# Patient Record
Sex: Male | Born: 1940 | Hispanic: No | Smoking: Former smoker
Health system: Southern US, Community
[De-identification: ages and names within clinical notes are randomized; demographics above are authoritative.]

## PROBLEM LIST (undated history)

## (undated) DIAGNOSIS — F319 Bipolar disorder, unspecified: Secondary | ICD-10-CM

## (undated) DIAGNOSIS — F32A Depression, unspecified: Secondary | ICD-10-CM

## (undated) DIAGNOSIS — I10 Essential (primary) hypertension: Secondary | ICD-10-CM

## (undated) DIAGNOSIS — F329 Major depressive disorder, single episode, unspecified: Secondary | ICD-10-CM

## (undated) DIAGNOSIS — K5730 Diverticulosis of large intestine without perforation or abscess without bleeding: Secondary | ICD-10-CM

## (undated) DIAGNOSIS — D369 Benign neoplasm, unspecified site: Secondary | ICD-10-CM

## (undated) DIAGNOSIS — K635 Polyp of colon: Secondary | ICD-10-CM

## (undated) DIAGNOSIS — M1990 Unspecified osteoarthritis, unspecified site: Secondary | ICD-10-CM

## (undated) DIAGNOSIS — K589 Irritable bowel syndrome without diarrhea: Secondary | ICD-10-CM

## (undated) DIAGNOSIS — K2970 Gastritis, unspecified, without bleeding: Secondary | ICD-10-CM

## (undated) DIAGNOSIS — M6289 Other specified disorders of muscle: Secondary | ICD-10-CM

## (undated) DIAGNOSIS — K5792 Diverticulitis of intestine, part unspecified, without perforation or abscess without bleeding: Secondary | ICD-10-CM

## (undated) DIAGNOSIS — K219 Gastro-esophageal reflux disease without esophagitis: Secondary | ICD-10-CM

## (undated) DIAGNOSIS — D473 Essential (hemorrhagic) thrombocythemia: Secondary | ICD-10-CM

## (undated) DIAGNOSIS — K644 Residual hemorrhoidal skin tags: Secondary | ICD-10-CM

## (undated) DIAGNOSIS — G90519 Complex regional pain syndrome I of unspecified upper limb: Secondary | ICD-10-CM

## (undated) DIAGNOSIS — K648 Other hemorrhoids: Secondary | ICD-10-CM

## (undated) DIAGNOSIS — Z8673 Personal history of transient ischemic attack (TIA), and cerebral infarction without residual deficits: Secondary | ICD-10-CM

## (undated) HISTORY — PX: OTHER SURGICAL HISTORY: SHX169

## (undated) HISTORY — DX: Major depressive disorder, single episode, unspecified: F32.9

## (undated) HISTORY — DX: Depression, unspecified: F32.A

## (undated) HISTORY — DX: Benign neoplasm, unspecified site: D36.9

## (undated) HISTORY — DX: Unspecified osteoarthritis, unspecified site: M19.90

## (undated) HISTORY — DX: Diverticulosis of large intestine without perforation or abscess without bleeding: K57.30

## (undated) HISTORY — PX: TONSILLECTOMY: SHX5217

## (undated) HISTORY — DX: Gastritis, unspecified, without bleeding: K29.70

## (undated) HISTORY — DX: Personal history of transient ischemic attack (TIA), and cerebral infarction without residual deficits: Z86.73

## (undated) HISTORY — DX: Diverticulitis of intestine, part unspecified, without perforation or abscess without bleeding: K57.92

## (undated) HISTORY — DX: Bipolar disorder, unspecified: F31.9

## (undated) HISTORY — DX: Residual hemorrhoidal skin tags: K64.4

## (undated) HISTORY — PX: SPINE SURGERY: SHX786

## (undated) HISTORY — PX: CARPAL TUNNEL RELEASE: SHX101

## (undated) HISTORY — DX: Other hemorrhoids: K64.8

## (undated) HISTORY — DX: Polyp of colon: K63.5

## (undated) HISTORY — DX: Essential (primary) hypertension: I10

## (undated) HISTORY — DX: Complex regional pain syndrome I of unspecified upper limb: G90.519

## (undated) HISTORY — DX: Essential (hemorrhagic) thrombocythemia: D47.3

## (undated) HISTORY — DX: Gastro-esophageal reflux disease without esophagitis: K21.9

## (undated) HISTORY — DX: Other specified disorders of muscle: M62.89

## (undated) HISTORY — DX: Irritable bowel syndrome, unspecified: K58.9

## (undated) HISTORY — PX: APPENDECTOMY: SHX54

---

## 1999-05-30 ENCOUNTER — Encounter: Payer: Self-pay | Admitting: *Deleted

## 1999-05-30 ENCOUNTER — Ambulatory Visit (HOSPITAL_COMMUNITY): Admission: RE | Admit: 1999-05-30 | Discharge: 1999-05-30 | Payer: Self-pay | Admitting: *Deleted

## 1999-06-04 ENCOUNTER — Encounter: Payer: Self-pay | Admitting: *Deleted

## 1999-06-04 ENCOUNTER — Encounter: Admission: RE | Admit: 1999-06-04 | Discharge: 1999-06-04 | Payer: Self-pay | Admitting: *Deleted

## 1999-06-05 ENCOUNTER — Encounter: Payer: Self-pay | Admitting: *Deleted

## 1999-08-19 ENCOUNTER — Encounter: Payer: Self-pay | Admitting: Neurosurgery

## 1999-08-20 ENCOUNTER — Encounter: Payer: Self-pay | Admitting: Neurosurgery

## 1999-08-20 ENCOUNTER — Ambulatory Visit (HOSPITAL_COMMUNITY): Admission: RE | Admit: 1999-08-20 | Discharge: 1999-08-20 | Payer: Self-pay | Admitting: Neurosurgery

## 1999-10-08 ENCOUNTER — Encounter: Payer: Self-pay | Admitting: Neurosurgery

## 1999-10-08 ENCOUNTER — Ambulatory Visit (HOSPITAL_COMMUNITY): Admission: RE | Admit: 1999-10-08 | Discharge: 1999-10-08 | Payer: Self-pay | Admitting: Neurosurgery

## 2001-02-24 ENCOUNTER — Encounter: Payer: Self-pay | Admitting: Neurosurgery

## 2001-02-24 ENCOUNTER — Encounter: Admission: RE | Admit: 2001-02-24 | Discharge: 2001-02-24 | Payer: Self-pay | Admitting: Neurosurgery

## 2001-04-01 ENCOUNTER — Ambulatory Visit (HOSPITAL_BASED_OUTPATIENT_CLINIC_OR_DEPARTMENT_OTHER): Admission: RE | Admit: 2001-04-01 | Discharge: 2001-04-01 | Payer: Self-pay | Admitting: Neurosurgery

## 2002-06-23 ENCOUNTER — Encounter: Payer: Self-pay | Admitting: Family Medicine

## 2002-06-23 ENCOUNTER — Encounter: Admission: RE | Admit: 2002-06-23 | Discharge: 2002-06-23 | Payer: Self-pay | Admitting: Family Medicine

## 2002-07-05 ENCOUNTER — Encounter: Payer: Self-pay | Admitting: Internal Medicine

## 2002-07-18 ENCOUNTER — Encounter: Payer: Self-pay | Admitting: Internal Medicine

## 2002-09-20 ENCOUNTER — Encounter: Payer: Self-pay | Admitting: Internal Medicine

## 2002-10-04 ENCOUNTER — Encounter: Payer: Self-pay | Admitting: Internal Medicine

## 2002-10-04 HISTORY — PX: ESOPHAGOGASTRODUODENOSCOPY: SHX1529

## 2004-01-08 ENCOUNTER — Emergency Department (HOSPITAL_COMMUNITY): Admission: EM | Admit: 2004-01-08 | Discharge: 2004-01-08 | Payer: Self-pay | Admitting: Emergency Medicine

## 2004-05-10 ENCOUNTER — Emergency Department (HOSPITAL_COMMUNITY): Admission: EM | Admit: 2004-05-10 | Discharge: 2004-05-10 | Payer: Self-pay | Admitting: Emergency Medicine

## 2004-06-11 ENCOUNTER — Ambulatory Visit: Payer: Self-pay | Admitting: Family Medicine

## 2004-06-25 ENCOUNTER — Ambulatory Visit: Payer: Self-pay | Admitting: Family Medicine

## 2004-07-17 ENCOUNTER — Ambulatory Visit (HOSPITAL_BASED_OUTPATIENT_CLINIC_OR_DEPARTMENT_OTHER): Admission: RE | Admit: 2004-07-17 | Discharge: 2004-07-17 | Payer: Self-pay | Admitting: Orthopedic Surgery

## 2004-07-17 ENCOUNTER — Ambulatory Visit (HOSPITAL_COMMUNITY): Admission: RE | Admit: 2004-07-17 | Discharge: 2004-07-17 | Payer: Self-pay | Admitting: Orthopedic Surgery

## 2004-11-11 ENCOUNTER — Encounter: Admission: RE | Admit: 2004-11-11 | Discharge: 2004-11-11 | Payer: Self-pay | Admitting: Neurology

## 2005-04-22 ENCOUNTER — Ambulatory Visit: Payer: Self-pay | Admitting: Family Medicine

## 2005-04-25 ENCOUNTER — Inpatient Hospital Stay (HOSPITAL_COMMUNITY): Admission: EM | Admit: 2005-04-25 | Discharge: 2005-05-06 | Payer: Self-pay | Admitting: Emergency Medicine

## 2005-04-25 ENCOUNTER — Encounter: Admission: RE | Admit: 2005-04-25 | Discharge: 2005-04-25 | Payer: Self-pay | Admitting: Family Medicine

## 2005-05-20 ENCOUNTER — Ambulatory Visit: Payer: Self-pay | Admitting: Family Medicine

## 2005-06-03 ENCOUNTER — Encounter: Admission: RE | Admit: 2005-06-03 | Discharge: 2005-06-03 | Payer: Self-pay | Admitting: Neurosurgery

## 2005-06-04 ENCOUNTER — Ambulatory Visit: Payer: Self-pay | Admitting: Family Medicine

## 2005-06-10 ENCOUNTER — Encounter (INDEPENDENT_AMBULATORY_CARE_PROVIDER_SITE_OTHER): Payer: Self-pay | Admitting: Cardiology

## 2005-06-10 ENCOUNTER — Ambulatory Visit (HOSPITAL_COMMUNITY): Admission: RE | Admit: 2005-06-10 | Discharge: 2005-06-10 | Payer: Self-pay | Admitting: Family Medicine

## 2005-06-26 ENCOUNTER — Encounter: Admission: RE | Admit: 2005-06-26 | Discharge: 2005-06-26 | Payer: Self-pay | Admitting: Neurology

## 2005-07-04 ENCOUNTER — Ambulatory Visit (HOSPITAL_COMMUNITY): Admission: RE | Admit: 2005-07-04 | Discharge: 2005-07-04 | Payer: Self-pay | Admitting: *Deleted

## 2005-07-08 ENCOUNTER — Ambulatory Visit: Payer: Self-pay | Admitting: Family Medicine

## 2005-10-10 ENCOUNTER — Encounter: Admission: RE | Admit: 2005-10-10 | Discharge: 2005-10-10 | Payer: Self-pay | Admitting: Neurosurgery

## 2006-08-13 ENCOUNTER — Ambulatory Visit: Payer: Self-pay | Admitting: Family Medicine

## 2006-08-13 LAB — CONVERTED CEMR LAB
ALT: 24 units/L (ref 0–40)
AST: 24 units/L (ref 0–37)
Albumin: 4 g/dL (ref 3.5–5.2)
Alkaline Phosphatase: 45 units/L (ref 39–117)
BUN: 20 mg/dL (ref 6–23)
Basophils Absolute: 0 10*3/uL (ref 0.0–0.1)
Basophils Relative: 0.4 % (ref 0.0–1.0)
CO2: 28 meq/L (ref 19–32)
Calcium: 9.5 mg/dL (ref 8.4–10.5)
Chloride: 102 meq/L (ref 96–112)
Chol/HDL Ratio, serum: 2.3
Cholesterol: 187 mg/dL (ref 0–200)
Creatinine, Ser: 1.3 mg/dL (ref 0.4–1.5)
Eosinophil percent: 1.9 % (ref 0.0–5.0)
GFR calc non Af Amer: 59 mL/min
Glomerular Filtration Rate, Af Am: 71 mL/min/{1.73_m2}
Glucose, Bld: 114 mg/dL — ABNORMAL HIGH (ref 70–99)
HCT: 39.6 % (ref 39.0–52.0)
HDL: 81.7 mg/dL (ref >39.0)
Hemoglobin: 14 g/dL (ref 13.0–17.0)
Hgb A1c MFr Bld: 6.2 % — ABNORMAL HIGH (ref 4.6–6.0)
LDL Cholesterol: 95 mg/dL (ref 0–99)
Lymphocytes Relative: 26 % (ref 12.0–46.0)
MCHC: 35.3 g/dL (ref 30.0–36.0)
MCV: 99.1 fL (ref 78.0–100.0)
Monocytes Absolute: 0.8 10*3/uL — ABNORMAL HIGH (ref 0.2–0.7)
Monocytes Relative: 9.1 % (ref 3.0–11.0)
Neutro Abs: 5.3 10*3/uL (ref 1.4–7.7)
Neutrophils Relative %: 62.6 % (ref 43.0–77.0)
Platelets: 243 10*3/uL (ref 150–400)
Potassium: 3.7 meq/L (ref 3.5–5.1)
RBC: 4 M/uL — ABNORMAL LOW (ref 4.22–5.81)
RDW: 12.7 % (ref 11.5–14.6)
Sodium: 140 meq/L (ref 135–145)
TSH: 1.35 microintl units/mL (ref 0.35–5.50)
Total Bilirubin: 0.9 mg/dL (ref 0.3–1.2)
Total Protein: 7.8 g/dL (ref 6.0–8.3)
Triglyceride fasting, serum: 50 mg/dL (ref 0–149)
VLDL: 10 mg/dL (ref 0–40)
WBC: 8.5 10*3/uL (ref 4.5–10.5)

## 2006-09-10 IMAGING — CR DG CHEST 2V
2 series · 2 of 2 positions shown · non-contrast
Comparison: 04/26/05.

CLINICAL DATA: Preadmission for cerebral angio.
 CHEST - 2 VIEW:

[view not recorded (1 of 2)]
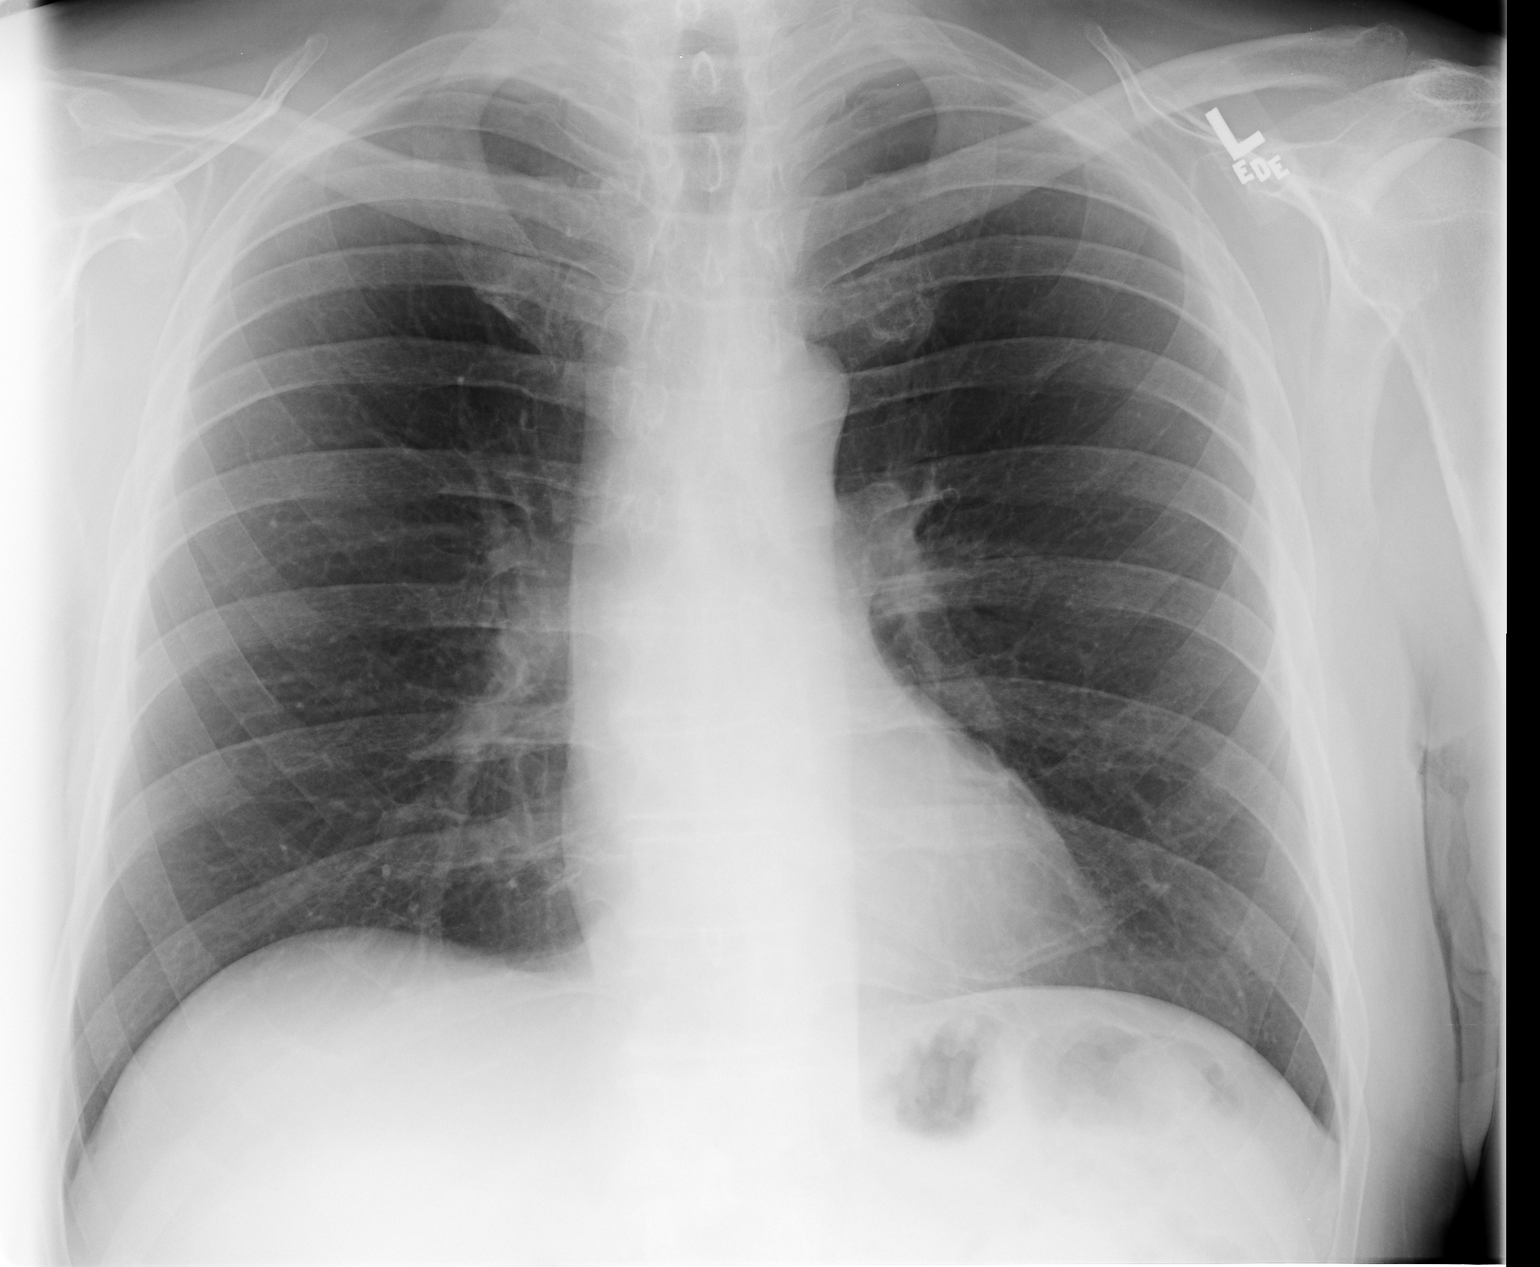

[view not recorded (2 of 2)]
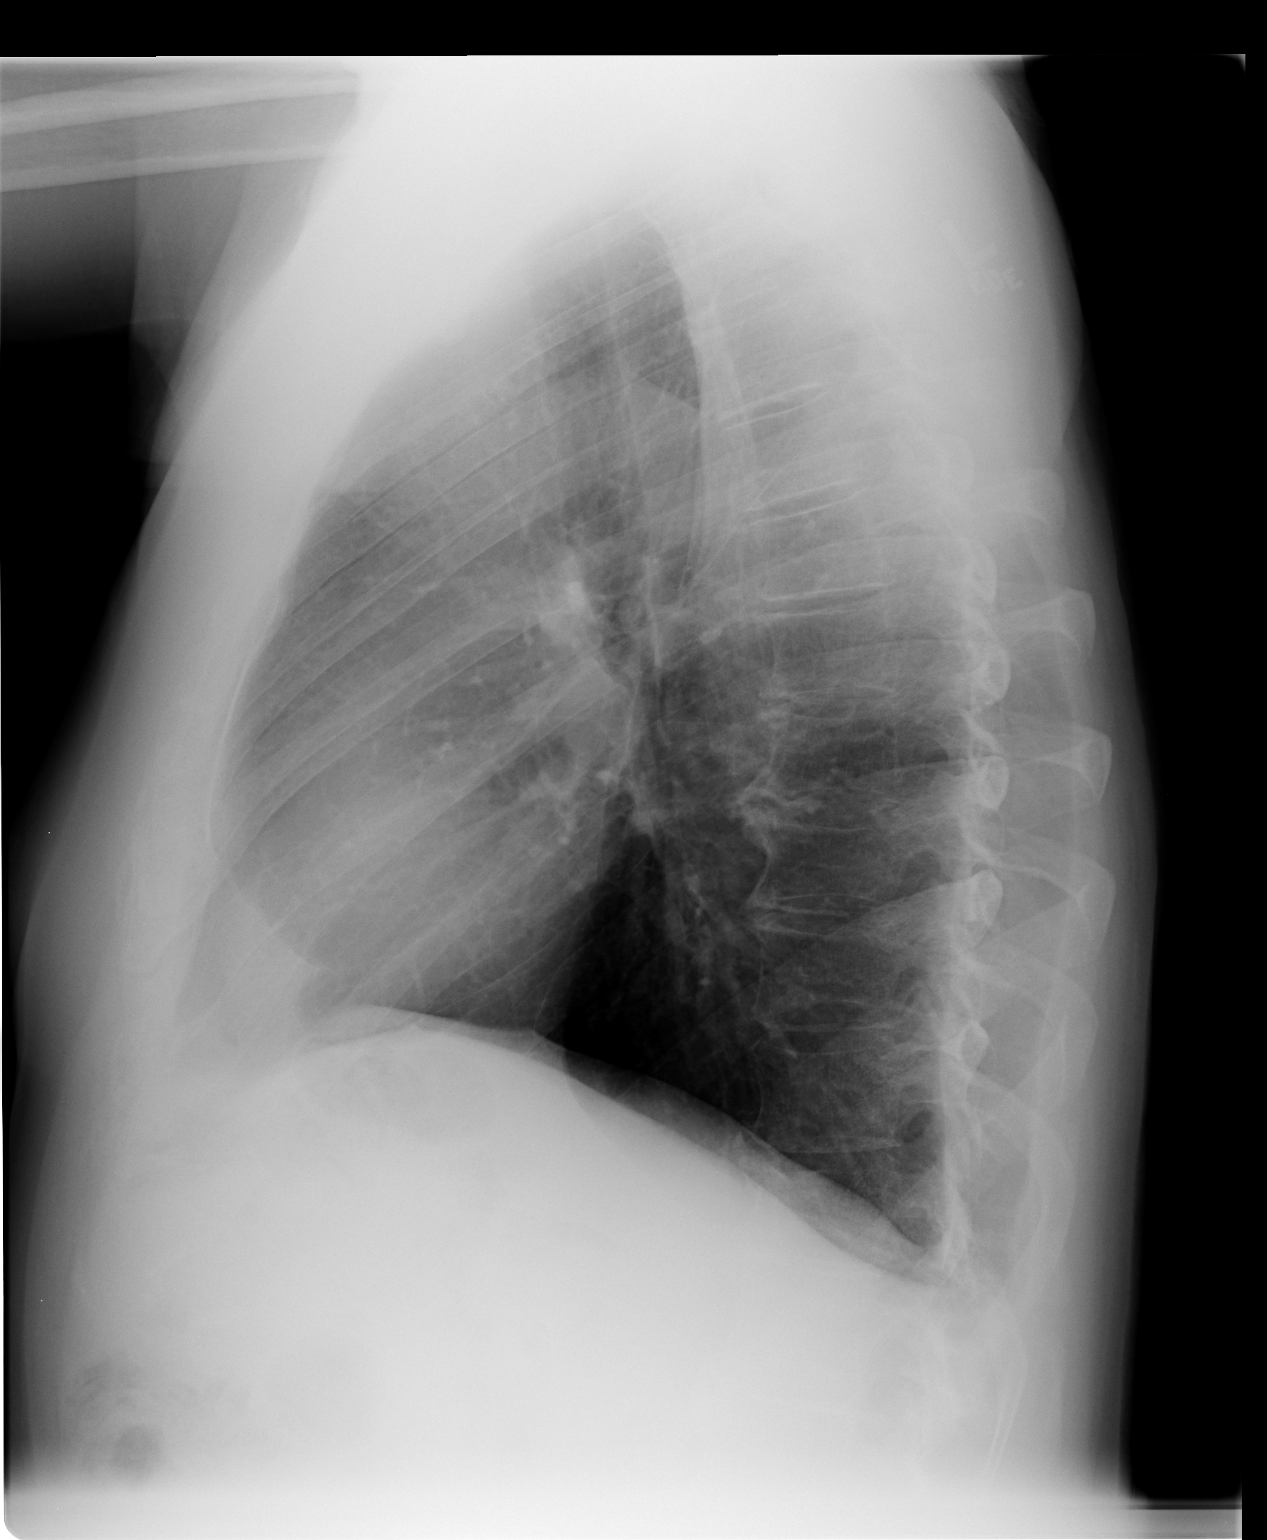

[2 of 2 positions shown; findings below may reference images not displayed]

The heart size and mediastinal contours are within normal limits.  Both lungs are clear.  The visualized skeletal structures are unremarkable.
IMPRESSION: No active cardiopulmonary disease.

## 2007-05-05 DIAGNOSIS — I10 Essential (primary) hypertension: Secondary | ICD-10-CM

## 2007-05-05 DIAGNOSIS — K219 Gastro-esophageal reflux disease without esophagitis: Secondary | ICD-10-CM | POA: Insufficient documentation

## 2007-09-27 ENCOUNTER — Ambulatory Visit: Payer: Self-pay | Admitting: Family Medicine

## 2007-09-27 DIAGNOSIS — G90519 Complex regional pain syndrome I of unspecified upper limb: Secondary | ICD-10-CM

## 2007-10-08 ENCOUNTER — Telehealth: Payer: Self-pay | Admitting: Family Medicine

## 2008-05-10 ENCOUNTER — Telehealth: Payer: Self-pay | Admitting: Family Medicine

## 2008-12-20 ENCOUNTER — Emergency Department (HOSPITAL_COMMUNITY): Admission: EM | Admit: 2008-12-20 | Discharge: 2008-12-20 | Payer: Self-pay | Admitting: Emergency Medicine

## 2008-12-20 ENCOUNTER — Encounter: Payer: Self-pay | Admitting: Family Medicine

## 2008-12-25 ENCOUNTER — Ambulatory Visit: Payer: Self-pay | Admitting: Family Medicine

## 2008-12-25 DIAGNOSIS — I6350 Cerebral infarction due to unspecified occlusion or stenosis of unspecified cerebral artery: Secondary | ICD-10-CM | POA: Insufficient documentation

## 2008-12-25 DIAGNOSIS — Z8679 Personal history of other diseases of the circulatory system: Secondary | ICD-10-CM | POA: Insufficient documentation

## 2009-01-18 ENCOUNTER — Encounter: Payer: Self-pay | Admitting: Family Medicine

## 2009-02-13 ENCOUNTER — Telehealth (INDEPENDENT_AMBULATORY_CARE_PROVIDER_SITE_OTHER): Payer: Self-pay | Admitting: *Deleted

## 2009-02-20 ENCOUNTER — Ambulatory Visit: Payer: Self-pay | Admitting: Family Medicine

## 2009-02-20 DIAGNOSIS — K5732 Diverticulitis of large intestine without perforation or abscess without bleeding: Secondary | ICD-10-CM | POA: Insufficient documentation

## 2009-03-01 ENCOUNTER — Ambulatory Visit: Payer: Self-pay | Admitting: Family Medicine

## 2009-03-01 DIAGNOSIS — K5900 Constipation, unspecified: Secondary | ICD-10-CM | POA: Insufficient documentation

## 2009-03-02 ENCOUNTER — Telehealth (INDEPENDENT_AMBULATORY_CARE_PROVIDER_SITE_OTHER): Payer: Self-pay | Admitting: *Deleted

## 2009-03-06 ENCOUNTER — Telehealth (INDEPENDENT_AMBULATORY_CARE_PROVIDER_SITE_OTHER): Payer: Self-pay | Admitting: *Deleted

## 2009-03-12 ENCOUNTER — Telehealth: Payer: Self-pay | Admitting: Family Medicine

## 2009-03-12 ENCOUNTER — Telehealth: Payer: Self-pay | Admitting: Internal Medicine

## 2009-03-15 ENCOUNTER — Ambulatory Visit: Payer: Self-pay | Admitting: Internal Medicine

## 2009-03-15 DIAGNOSIS — K589 Irritable bowel syndrome without diarrhea: Secondary | ICD-10-CM

## 2009-03-15 LAB — CONVERTED CEMR LAB
ALT: 27 units/L (ref 0–53)
AST: 24 units/L (ref 0–37)
Albumin: 4.6 g/dL (ref 3.5–5.2)
Alkaline Phosphatase: 38 units/L — ABNORMAL LOW (ref 39–117)
BUN: 22 mg/dL (ref 6–23)
Basophils Absolute: 0 10*3/uL (ref 0.0–0.1)
Basophils Relative: 0.6 % (ref 0.0–3.0)
CO2: 27 meq/L (ref 19–32)
Calcium: 9.7 mg/dL (ref 8.4–10.5)
Chloride: 100 meq/L (ref 96–112)
Creatinine, Ser: 1.4 mg/dL (ref 0.4–1.5)
Eosinophils Absolute: 0.2 10*3/uL (ref 0.0–0.7)
Eosinophils Relative: 2 % (ref 0.0–5.0)
GFR calc non Af Amer: 53.54 mL/min (ref >60)
Glucose, Bld: 109 mg/dL — ABNORMAL HIGH (ref 70–99)
HCT: 37.5 % — ABNORMAL LOW (ref 39.0–52.0)
Hemoglobin: 13.5 g/dL (ref 13.0–17.0)
IgA: 354 mg/dL (ref 68–378)
Lymphocytes Relative: 33.1 % (ref 12.0–46.0)
Lymphs Abs: 2.6 10*3/uL (ref 0.7–4.0)
MCHC: 35.8 g/dL (ref 30.0–36.0)
MCV: 98.9 fL (ref 78.0–100.0)
Monocytes Absolute: 0.8 10*3/uL (ref 0.1–1.0)
Monocytes Relative: 10.5 % (ref 3.0–12.0)
Neutro Abs: 4.3 10*3/uL (ref 1.4–7.7)
Neutrophils Relative %: 53.8 % (ref 43.0–77.0)
Platelets: 247 10*3/uL (ref 150.0–400.0)
Potassium: 3.6 meq/L (ref 3.5–5.1)
RBC: 3.8 M/uL — ABNORMAL LOW (ref 4.22–5.81)
RDW: 12.7 % (ref 11.5–14.6)
Sodium: 138 meq/L (ref 135–145)
TSH: 0.88 microintl units/mL (ref 0.35–5.50)
Total Bilirubin: 0.7 mg/dL (ref 0.3–1.2)
Total Protein: 7.9 g/dL (ref 6.0–8.3)
WBC: 7.9 10*3/uL (ref 4.5–10.5)

## 2009-03-23 ENCOUNTER — Telehealth: Payer: Self-pay | Admitting: Internal Medicine

## 2009-03-23 LAB — CONVERTED CEMR LAB: Tissue Transglutaminase Ab, IgA: 0.5 units (ref <7)

## 2009-03-29 ENCOUNTER — Telehealth: Payer: Self-pay | Admitting: Internal Medicine

## 2009-03-29 ENCOUNTER — Telehealth (INDEPENDENT_AMBULATORY_CARE_PROVIDER_SITE_OTHER): Payer: Self-pay | Admitting: *Deleted

## 2009-03-29 ENCOUNTER — Ambulatory Visit: Payer: Self-pay | Admitting: Internal Medicine

## 2009-04-18 ENCOUNTER — Telehealth: Payer: Self-pay | Admitting: Internal Medicine

## 2009-04-26 ENCOUNTER — Ambulatory Visit: Payer: Self-pay | Admitting: Internal Medicine

## 2009-04-26 ENCOUNTER — Telehealth: Payer: Self-pay | Admitting: Internal Medicine

## 2009-04-26 DIAGNOSIS — F411 Generalized anxiety disorder: Secondary | ICD-10-CM | POA: Insufficient documentation

## 2009-04-26 DIAGNOSIS — K644 Residual hemorrhoidal skin tags: Secondary | ICD-10-CM | POA: Insufficient documentation

## 2009-05-08 ENCOUNTER — Encounter: Payer: Self-pay | Admitting: Internal Medicine

## 2009-05-08 ENCOUNTER — Ambulatory Visit: Payer: Self-pay | Admitting: Internal Medicine

## 2009-05-08 ENCOUNTER — Telehealth: Payer: Self-pay | Admitting: Nurse Practitioner

## 2009-05-08 DIAGNOSIS — D369 Benign neoplasm, unspecified site: Secondary | ICD-10-CM

## 2009-05-08 HISTORY — DX: Benign neoplasm, unspecified site: D36.9

## 2009-05-08 HISTORY — PX: COLONOSCOPY: SHX174

## 2009-05-17 ENCOUNTER — Telehealth: Payer: Self-pay | Admitting: Internal Medicine

## 2009-07-17 ENCOUNTER — Telehealth: Payer: Self-pay | Admitting: Family Medicine

## 2009-07-19 ENCOUNTER — Telehealth: Payer: Self-pay | Admitting: Family Medicine

## 2009-09-20 ENCOUNTER — Telehealth: Payer: Self-pay | Admitting: Family Medicine

## 2009-09-24 ENCOUNTER — Ambulatory Visit: Payer: Self-pay | Admitting: Family Medicine

## 2009-10-10 ENCOUNTER — Telehealth: Payer: Self-pay | Admitting: Family Medicine

## 2009-11-26 ENCOUNTER — Ambulatory Visit: Payer: Self-pay | Admitting: Family Medicine

## 2009-12-31 ENCOUNTER — Telehealth: Payer: Self-pay | Admitting: Family Medicine

## 2010-01-02 ENCOUNTER — Ambulatory Visit: Payer: Self-pay | Admitting: Family Medicine

## 2010-03-05 ENCOUNTER — Telehealth: Payer: Self-pay | Admitting: Family Medicine

## 2010-03-11 ENCOUNTER — Telehealth: Payer: Self-pay | Admitting: Family Medicine

## 2010-05-21 ENCOUNTER — Telehealth: Payer: Self-pay | Admitting: Family Medicine

## 2010-05-22 ENCOUNTER — Ambulatory Visit: Payer: Self-pay | Admitting: Family Medicine

## 2010-05-22 DIAGNOSIS — F319 Bipolar disorder, unspecified: Secondary | ICD-10-CM

## 2010-07-12 ENCOUNTER — Emergency Department (HOSPITAL_COMMUNITY)
Admission: EM | Admit: 2010-07-12 | Discharge: 2010-07-12 | Payer: Self-pay | Source: Home / Self Care | Admitting: Emergency Medicine

## 2010-07-16 ENCOUNTER — Telehealth: Payer: Self-pay | Admitting: Internal Medicine

## 2010-07-17 ENCOUNTER — Telehealth: Payer: Self-pay | Admitting: Internal Medicine

## 2010-07-19 ENCOUNTER — Telehealth: Payer: Self-pay | Admitting: Internal Medicine

## 2010-07-22 ENCOUNTER — Telehealth: Payer: Self-pay | Admitting: Internal Medicine

## 2010-07-26 ENCOUNTER — Emergency Department: Payer: Self-pay | Admitting: Unknown Physician Specialty

## 2010-08-07 ENCOUNTER — Encounter (INDEPENDENT_AMBULATORY_CARE_PROVIDER_SITE_OTHER): Payer: Self-pay | Admitting: *Deleted

## 2010-08-07 ENCOUNTER — Emergency Department (HOSPITAL_COMMUNITY)
Admission: EM | Admit: 2010-08-07 | Discharge: 2010-08-07 | Payer: Self-pay | Source: Home / Self Care | Admitting: Emergency Medicine

## 2010-08-14 ENCOUNTER — Ambulatory Visit
Admission: RE | Admit: 2010-08-14 | Discharge: 2010-08-14 | Payer: Self-pay | Source: Home / Self Care | Attending: Internal Medicine | Admitting: Internal Medicine

## 2010-08-16 ENCOUNTER — Telehealth: Payer: Self-pay | Admitting: Internal Medicine

## 2010-08-19 ENCOUNTER — Telehealth: Payer: Self-pay | Admitting: Internal Medicine

## 2010-08-31 ENCOUNTER — Encounter: Payer: Self-pay | Admitting: *Deleted

## 2010-09-12 NOTE — Progress Notes (Signed)
Summary: Triage  Phone Note Call from Patient Call back at Home Phone [phone]   Caller: Patient Call For: Dr. Leone Payor Reason for Call: Talk to Nurse Summary of Call: abd pain and discomfort, diarrhea "symptoms are worse than ever" Initial call taken by: Karna Christmas,  July 19, 2010 9:11 AM  Follow-up for Phone Call        Patient  has had some abdominal discomfort this am and feels he is going to have diarrhea.  He wants to know what he can take.  Patient  is advised if he is having abdominal pain he should take his pain meds previously prescribed tramadol or Tylenol #3.  He is advised not to take imodium unless he has multiple diarrhea stools.  he is asked to go on a bland diet with low fiber for a few days and can resume his high fiber diet when his stomach is no longer "upset".  Patient  will call back for further problems or concerns. Follow-up by: Darcey Nora RN, CGRN,  July 19, 2010 9:56 AM  Additional Follow-up for Phone Call Additional follow up Details #1::       Additional Follow-up by: Iva Boop MD, [facility],  July 19, 2010 10:00 AM

## 2010-09-12 NOTE — Progress Notes (Signed)
Summary: FYI  Phone Note Call from Patient   Caller: Patient Call For: [name] MD Summary of Call: Pt is calling to let Dr. Clent Ridges know how he is doing with the Tylenol # 3.  It is not working as well, and it making him very constipated, and when he takes a laxative, he has no control of bowels. [facility] getting [facility] frustrated and depressed. [phone] Initial call taken by: Lynann Beaver CMA,  October 10, 2009 9:44 AM  Follow-up for Phone Call        try Tramadol 50 mg , take 1 or 2 q 6 hours as needed pain, call in #60 with 5 rf Follow-up by: [name] MD,  October 10, 2009 12:04 PM  Additional Follow-up for Phone Call Additional follow up Details #1::        Phone Call Completed, Rx Called In Additional Follow-up by: Alfred Levins, CMA,  October 10, 2009 1:30 PM    New/Updated Medications: TRAMADOL HCL 50 MG  TABS (TRAMADOL HCL) 1-2 by mouth every 6 hours as needed for pain Prescriptions: TRAMADOL HCL 50 MG  TABS (TRAMADOL HCL) 1-2 by mouth every 6 hours as needed for pain  #60 x 5   Entered by:   Alfred Levins, CMA   Authorized by:   [name] MD   Signed by:   Alfred Levins, CMA on 10/10/2009   Method used:   [facility]  [address] [id number]       [address]       [place], [place]  [address]       Ph: [phone] or [phone]       Fax: [phone]   RxID:   [id number]

## 2010-09-12 NOTE — Assessment & Plan Note (Signed)
Summary: constipation/congestion/ recent diagnosis of Bipolar/dm   Vital Signs:  Patient profile:   70 year old male Weight:      178 pounds O2 Sat:      96 % Temp:     98.2 degrees F Pulse rate:   66 / minute BP sitting:   122 / 82  (right arm) Cuff size:   regular  Vitals Entered By: Pura Spice, RN (May 22, 2010 10:50 AM)  Contraindications/Deferment of Procedures/Staging:    Test/Procedure: FLU VAX    Reason for deferment: patient declined  CC: feels like intestines pushing against lungs and "multiple Issues"  States sees psychiatrist in [place]   History of Present Illness: Here to follow up after a 2 week psychiatric stay at [facility] for showing threatening behavior toward his [name] and for possibly being suicidal. he was diagnosed with Bipolar Disorder, and after discharge he has been seeing a psychiatrist in [place] named Dr. Lynnae Sandhoff. He was put on Paroxetene and Risperdol, in addition to the Trazodone he had been taking. John [name], but denies ever harming her physically. She has taken out a restraining order against him, and she has filed for a divorce from him. She has emptied all of their joint bank accounts, and he now says he has no money. He had been living at a motel, and he does not know where to go from here. He admits to buying a "helium kit" off the Internet, and he has done research on line about how to commit suicide by breathing helium. He says he does not intend to do this anytime soon, but he wanted to have this available for the future.   Allergies: 1)  ! * Latex 2)  ! Hydrocodone  Past History:  Past Medical History: GERD Hypertension Diverticulitis, hx of Diverticulosis, colon reflex sympathetic dystrophy left arm Cerebrovascular accident, hx of. Has had two so far IBS, sees Dr. Leone Payor bipolar disorder, sees Dr. Lynnae Sandhoff in [place]  [place]  Review of Systems  The patient denies anorexia, fever, weight loss, weight gain, vision loss, decreased hearing, hoarseness, chest pain, syncope, dyspnea on exertion, peripheral edema, prolonged cough, headaches, hemoptysis, abdominal pain, melena, hematochezia, severe indigestion/heartburn, hematuria, incontinence, genital sores, muscle weakness, suspicious skin lesions, transient blindness, difficulty walking, unusual weight change, abnormal bleeding, enlarged lymph nodes, angioedema, breast masses, and testicular masses.    Physical Exam  General:  Well-developed,well-nourished,in no acute distress; alert,appropriate and cooperative throughout examination Lungs:  Normal respiratory effort, chest expands symmetrically. Lungs are clear to auscultation, no crackles or wheezes. Heart:  Normal rate and regular rhythm. S1 and S2 normal without gallop, murmur, click, rub or other extra sounds. Psych:  Oriented X3, memory intact for recent and remote, normally interactive, good eye contact, labile affect, tearful, and moderately anxious.     Impression & Recommendations:  Problem # 1:  BIPOLAR AFFECTIVE DISORDER (ICD-296.80)  Orders: Home Health Referral (Home Health)  Problem # 2:  IRRITABLE BOWEL SYNDROME (ICD-564.1)  Complete Medication List: 1)  Atenolol-chlorthalidone 50-25 Mg Tabs (Atenolol-chlorthalidone) .... Every morning 2)  Adult Aspirin Ec Low Strength 81 Mg Tbec (Aspirin) .... Once daily 3)  Imodium Advanced 2-125 Mg Tabs (Loperamide-simethicone) .... As needed 4)  Tylenol With Codeine #3 300-30 Mg Tabs (Acetaminophen-codeine) .Marland Kitchen.. 1 q 6 hours as needed pain 5)  Tramadol Hcl 50 Mg Tabs (Tramadol hcl) .Marland Kitchen.. 1-2 by mouth every  6 hours as needed for pain 6)  Trazodone Hcl 100 Mg Tabs (Trazodone hcl) .... At bedtime 7)  Paroxetine Hcl 10 Mg Tabs (Paroxetine hcl) .... Once daily 8)  Risperdal 1 Mg Tabs (Risperidone) .... Once daily  Patient Instructions: 1)  He seems to be  fairly stable right now from a psychiatric standpoint, and he does intend to follow up with Dr. Donnalee Curry next week. We will set up a Home Health Social Work consult to meet with him and to give him advice about where to live, finances, possibly applying for Medicaid, etc.

## 2010-09-12 NOTE — Progress Notes (Signed)
Summary: Referral  Phone Note Call from Patient   Summary of Call: Pt called requesting a referral to [facility] for Infectious Disease Selena Batten). Pt states he has done some research on Lyme Disease and thinks this is what he may have? Please advise? Pt would like a call back today or tomorrow stating if referral was done [phone] Initial call taken by: Josph Macho RMA,  March 11, 2010 4:07 PM  Follow-up for Phone Call        please do this referral  Follow-up by: [name] MD,  March 13, 2010 8:44 AM

## 2010-09-12 NOTE — Assessment & Plan Note (Signed)
Summary: diarrhea/dm   Vital Signs:  Patient profile:   70 year old male Weight:      185 pounds BMI:     26.64 Temp:     98.2 degrees F oral BP sitting:   130 / 88  (left arm) Cuff size:   regular  Vitals Entered By: Raechel Ache, RN (November 26, 2009 4:37 PM) CC: C/o constipation alt with diarrhea; which has been bad last few days- can't stay out of the bathroom. Abd burning, nausea and noisy.   History of Present Illness: Here with continuing problems of severe diarrhea and urgency and frequency of BMs which alternate with constipation. He has not seen Dr. Leone Payor for some time. He uses magnesium citrate and Fleet enemas when constipated , then uses Lomotil when he gets diarrhea. He had tried nortripyline last year but stopped because it did not help. He denies any fever or nausea lately.   Allergies: 1)  ! * Latex 2)  ! Hydrocodone  Past History:  Past Medical History: GERD Hypertension Diverticulitis, hx of Diverticulosis, colon reflex sympathetic dystrophy left arm Cerebrovascular accident, hx of. Has had two so far IBS, sees Dr. Leone Payor  Past Surgical History: Reviewed history from 09/24/2009 and no changes required. colonoscopy 05-08-09 per Dr. Leone Payor, polyps and diverticulosis, repeat in 5 yrs Appendectomy Left Elbow Surgery  x 2 Carpal Tunnel Surgery left arm  Surgery on spine   Review of Systems  The patient denies anorexia, fever, weight loss, weight gain, vision loss, decreased hearing, hoarseness, chest pain, syncope, dyspnea on exertion, peripheral edema, prolonged cough, headaches, hemoptysis, abdominal pain, melena, hematochezia, severe indigestion/heartburn, hematuria, incontinence, genital sores, muscle weakness, suspicious skin lesions, transient blindness, difficulty walking, depression, unusual weight change, abnormal bleeding, enlarged lymph nodes, angioedema, breast masses, and testicular masses.    Physical Exam  General:   Well-developed,well-nourished,in no acute distress; alert,appropriate and cooperative throughout examination Neck:  No deformities, masses, or tenderness noted. Lungs:  Normal respiratory effort, chest expands symmetrically. Lungs are clear to auscultation, no crackles or wheezes. Heart:  Normal rate and regular rhythm. S1 and S2 normal without gallop, murmur, click, rub or other extra sounds. Abdomen:  soft, normal bowel sounds, no distention, no masses, no guarding, no rigidity, no rebound tenderness, no abdominal hernia, no inguinal hernia, no hepatomegaly, and no splenomegaly.  Mildly tender diffusely   Impression & Recommendations:  Problem # 1:  IRRITABLE BOWEL SYNDROME (ICD-564.1)  Orders: Prescription Created Electronically [phone])  Problem # 2:  ANXIETY (ICD-300.00)  His updated medication list for this problem includes:    Trazodone Hcl 100 Mg Tabs (Trazodone hcl) .Marland Kitchen... At bedtime  Orders: Prescription Created Electronically [phone])  Complete Medication List: 1)  Atenolol-chlorthalidone 50-25 Mg Tabs (Atenolol-chlorthalidone) .... Every morning 2)  Adult Aspirin Ec Low Strength 81 Mg Tbec (Aspirin) .... Once daily 3)  Imodium Advanced 2-125 Mg Tabs (Loperamide-simethicone) .... As needed 4)  Tylenol With Codeine #3 300-30 Mg Tabs (Acetaminophen-codeine) .Marland Kitchen.. 1 q 6 hours as needed pain 5)  Tramadol Hcl 50 Mg Tabs (Tramadol hcl) .Marland Kitchen.. 1-2 by mouth every 6 hours as needed for pain 6)  Trazodone Hcl 100 Mg Tabs (Trazodone hcl) .... At bedtime  Patient Instructions: 1)  Try Trazodone daily. Call me in 2 weeks with an update Prescriptions: TRAZODONE HCL 100 MG TABS (TRAZODONE HCL) at bedtime  #30 x 5   Entered and Authorized by:   [name] MD   Signed by:   [name] MD  on 11/26/2009   Method used:   [facility]  [address] [id number]       [address]       [place], [place]  [address]       Ph: [phone] or  [phone]       Fax: [phone]   RxID:   [id number]

## 2010-09-12 NOTE — Progress Notes (Signed)
Summary: speak to nurse  Phone Note Call from Patient Call back at Home Phone [phone]   Caller: Patient Call For: Dr Leone Payor Reason for Call: Talk to Nurse Summary of Call: Patient wants to speak to nurse regarding instructions given to him yesterday. Initial call taken by: Tawni Levy,  August 16, 2010 8:36 AM  Follow-up for Phone Call        Pt states he has watery loose BM last night after taking the Miralax purge all day yesterday. Slept really well last night and had a small loose BM this morning.  Pt wants to know if he should continue the Miralax daily even if he is now having loose BM's and if so what should his diet consist of to help with keeping his bowels regular.  Additional Follow-up for Phone Call Additional follow up Details #1::        Sorting out period stay on at least 1 MiraLax daily for now and see what happens call back as needed  Additional Follow-up by: Iva Boop MD, Clementeen Graham,  August 16, 2010 9:33 AM    Additional Follow-up for Phone Call Additional follow up Details #2::    Pt notified to try Miralax once a day for now and if needed to increase, he could so to twice a day dosing. Otherwise, to call back as needed. Pt agreed and verbalized understanding.  Follow-up by: Christie Nottingham CMA Duncan Dull),  August 16, 2010 9:42 AM   Appended Document: speak to nurse Pt called back to state he cannot take Miralax today because his stomach is churning. Pt would like to take a Imodium for his diarrhea. Informed pt to not take Imodium and wait til tomorrow to take the Miralax and gives his bowels a rest. Told pt if no better to call on Monday. Pt verbalized understanding.

## 2010-09-12 NOTE — Progress Notes (Signed)
Summary: update.  Phone Note Call from Patient   Caller: Patient Call For: [name] MD Summary of Call: Pt is calling to give Dr. Clent Ridges an update on the Trazodone.  He uses it with Unisom, and it works fairly well.  His IBS symptoms are worse, and he is lethargic and sounds extremely depressed.Marland Kitchen  Has not motiviation or energy to do anything. [phone] Initial call taken by: Lynann Beaver CMA,  Dec 31, 2009 8:38 AM  Follow-up for Phone Call        dr Claris Che patient Follow-up by: Gordy Savers  MD,  Dec 31, 2009 12:53 PM  Additional Follow-up for Phone Call Additional follow up Details #1::        okay to stick with Unisom and Trazodone at night. He needs to come in to discuss depression with me when he is ready Additional Follow-up by: [name] MD,  Dec 31, 2009 3:50 PM    Additional Follow-up for Phone Call Additional follow up Details #2::    Pt notified of Dr. Claris Che recommendations. Follow-up by: Lynann Beaver CMA,  Dec 31, 2009 4:13 PM

## 2010-09-12 NOTE — Progress Notes (Signed)
Summary: OV  Phone Note Call from Patient   Caller: Patient Call For: [name] MD Summary of Call: cannot tolerate Vicodin- makes him too constipated and doesn't help pain. [facility]eds something else[facility] Initial call taken by: Raechel Ache, RN,  September 20, 2009 11:12 AM  Follow-up for Phone Call        make an OV to discuss this Follow-up by: [name] MD,  September 20, 2009 12:22 PM  Additional Follow-up for Phone Call Additional follow up Details #1::        OV scheduled. Additional Follow-up by: Lynann Beaver CMA,  September 20, 2009 12:45 PM

## 2010-09-12 NOTE — Assessment & Plan Note (Signed)
Summary: to discuss pain meds/dm   Vital Signs:  Patient profile:   70 year old male Weight:      184 pounds Temp:     98.1 degrees F oral Pulse rate:   72 / minute BP sitting:   134 / 88  (left arm) Cuff size:   large  Vitals Entered By: Alfred Levins, CMA (September 24, 2009 10:49 AM) CC: discuss med   History of Present Illness: Here to discuss problems with his pain management. he had done well with Darvocet over the years, but of course this was pulled from the market. We then tried Vicodin, which worked well for the pain, but it caused terrible constipation evn though he took it only twice a day. He tried to counter this with fiber, Miralax, milk of magnesia, etc. but nothing worked. he used glycerin suppositories and Fleet enemas to get any sort of bowel movements.   Current Medications (verified): 1)  Atenolol-Chlorthalidone 50-25 Mg  Tabs (Atenolol-Chlorthalidone) .... Every Morning 2)  Adult Aspirin Ec Low Strength 81 Mg Tbec (Aspirin) .... Once Daily 3)  Propoxyphene N-Apap 100-650 Mg Tabs (Propoxyphene N-Apap) .Marland Kitchen.. 1 By Mouth Every 6 Hours As Needed For Pain 4)  Imodium Advanced 2-125 Mg Tabs (Loperamide-Simethicone) .... As Needed 5)  Nortriptyline Hcl 25 Mg  Caps (Nortriptyline Hcl) .... 1/2 By Mouth Q Hs X 1 Week Then Increase To 1 Q Hs  Allergies: 1)  ! * Latex 2)  ! Hydrocodone  Past History:  Past Medical History: Reviewed history from 03/15/2009 and no changes required. GERD Hypertension Diverticulitis, hx of Diverticulosis, colon reflex sympathetic dystrophy left arm Cerebrovascular accident, hx of. Has had two so far IBS  Past Surgical History: colonoscopy 05-08-09 per Dr. Leone Payor, polyps and diverticulosis, repeat in 5 yrs Appendectomy Left Elbow Surgery  x 2 Carpal Tunnel Surgery left arm  Surgery on spine   Review of Systems  The patient denies anorexia, fever, weight loss, weight gain, vision loss, decreased hearing, hoarseness, chest pain,  syncope, dyspnea on exertion, peripheral edema, prolonged cough, headaches, hemoptysis, melena, hematochezia, severe indigestion/heartburn, hematuria, incontinence, genital sores, muscle weakness, suspicious skin lesions, transient blindness, difficulty walking, depression, unusual weight change, abnormal bleeding, enlarged lymph nodes, angioedema, breast masses, and testicular masses.    Physical Exam  General:  Well-developed,well-nourished,in no acute distress; alert,appropriate and cooperative throughout examination Abdomen:  Bowel sounds positive,abdomen soft and non-tender without masses, organomegaly or hernias noted.   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 Every morning 2)  Adult Aspirin Ec Low Strength 81 Mg Tbec (Aspirin) .... Once daily 3)  Imodium Advanced 2-125 Mg Tabs (Loperamide-simethicone) .... As needed 4)  Tylenol With Codeine #3 300-30 Mg Tabs (Acetaminophen-codeine) .Marland Kitchen.. 1 q 6 hours as needed pain  Patient Instructions: 1)  try Tylenol #3. let me know how this works. Prescriptions: TYLENOL WITH CODEINE #3 300-30 MG TABS (ACETAMINOPHEN-CODEINE) 1 q 6 hours as needed pain  #60 x 2   Entered and Authorized by:   [name] MD   Signed by:   [name] MD on 09/24/2009   Method used:   Print then Give to Patient   RxID:   [id number]

## 2010-09-12 NOTE — Progress Notes (Signed)
Summary: FYI  Phone Note Call from Patient   Caller: Patient Call For: [name] MD Summary of Call: Pt is complaining of constipation/diarrhea x 6 months.  Diagnosed as being Bipolar.......Multiple  complaints that will require a lengthy consultation.  Scheduled tomorrow. Initial call taken by: Lynann Beaver CMA,  May 21, 2010 4:06 PM  Follow-up for Phone Call        noted Follow-up by: [name] MD,  May 21, 2010 4:43 PM

## 2010-09-12 NOTE — Progress Notes (Signed)
Summary: Triage  Phone Note Call from Patient Call back at Home Phone [phone]   Caller: Patient Call For: Dr.Gessner Reason for Call: Talk to Nurse Summary of Call: Pt is having ongoing constipation with no relief, wants to discuss options with nurse Initial call taken by: [place] Johnson,  July 16, 2010 10:30 AM  Follow-up for Phone Call        Spoke with patient, states he is very contipated. Went to [facility] ER Friday and was told to take Miralax twice a day. This has not helped. States he has not had a bowel movement in 8 days now. Patient has made himself have a BM by using saline enemas and glycerin supp. Now states this is not working. Patient states he feels pressure and bloating but is only passing gas. Please advise. Selinda Michaels RN  July 16, 2010 11:19 AM       Try Colyte prep preceded by 4 Dulcolax follow-up call from him tomorrow Follow-up by: Iva Boop MD, Clementeen Graham,  July 16, 2010 11:09 AM  Additional Follow-up for Phone Call Additional follow up Details #1::        Patient notifed of Dr. Marvell Fuller recommendations. Instructed patient to call us tomorrow if not better. Additional Follow-up by: Selinda Michaels RN,  July 16, 2010 11:20 AM    New/Updated Medications: NULYTELY WITH FLAVOR PACKS 420 GM SOLR (PEG 3350-KCL-NA BICARB-NACL) Mix with water and drink 8oz every 15 minutes until gone DULCOLAX 5 MG TBEC (BISACODYL) Take 2 tablet by mouth with 1st glass of prep and 2 tablets with last glass of prep Prescriptions: DULCOLAX 5 MG TBEC (BISACODYL) Take 2 tablet by mouth with 1st glass of prep and 2 tablets with last glass of prep  #4 x 0   Entered by:   Selinda Michaels RN   Authorized by:   Iva Boop MD, [facility]   Signed by:   Selinda Michaels RN on 07/16/2010   Method used:   [facility]  [address] [id number]       [address]       [place], [place]  [address]       Ph: [phone] or [phone]       Fax:  [phone]   RxID:   [id number] NULYTELY WITH FLAVOR PACKS 420 GM SOLR (PEG 3350-KCL-NA BICARB-NACL) Mix with water and drink 8oz every 15 minutes until gone  #1 x 0   Entered by:   Selinda Michaels RN   Authorized by:   Iva Boop MD, [facility]   Signed by:   Selinda Michaels RN on 07/16/2010   Method used:   [facility]        [facility]  [address] [id number]       [address]       [place], [place]  [address]       Ph: [phone] or [phone]       Fax: [phone]   RxID:   [id number]

## 2010-09-12 NOTE — Progress Notes (Signed)
Summary: med ?'s  Phone Note Call from Patient Call back at [facility] Phone [phone]   Caller: Patient Call For: Dr. Leone Payor Reason for Call: Talk to Nurse Summary of Call: has ?'s about [facility] rx Initial call taken by: Vallarie Mare,  July 22, 2010 3:43 PM  Follow-up for Phone Call        Patient  states no further BM since Friday.  He denies taking imodium for the diarrhea he had.  I have asked him to resume his miralax two times a day and if no BM on the 3rd day he is asked to take dulcolax 2 tablets.  He will call me back for any further problems. Follow-up by: Darcey Nora RN, CGRN,  July 22, 2010 3:56 PM

## 2010-09-12 NOTE — Progress Notes (Signed)
Summary: Triage  Phone Note Call from Patient Call back at Home Phone [phone]   Caller: Patient Call For: Dr. Leone Payor Reason for Call: Talk to Nurse Summary of Call: Asking to speak directly to [facility]..."continuing phone call from last week" Initial call taken by: Karna Christmas,  August 19, 2010 10:00 AM  Follow-up for Phone Call        I have advised the patient to start back on Miralax 1-2 times a day as ordered.  Patient wants to try Metamucil.  I have advised him ok to try Metamucil, but he must drink plenty of fluid.   Follow-up by: Darcey Nora RN, CGRN,  August 19, 2010 11:14 AM

## 2010-09-12 NOTE — Progress Notes (Signed)
Summary: dr fry call please  Phone Note Call from Patient Call back at Home Phone [phone]   Caller: Patient Call For: [name] MD Summary of Call: Pt is requesting doc to call him personally. Initial call taken by: Heron Sabins,  March 05, 2010 9:44 AM  Follow-up for Phone Call        please call him Follow-up by: [name] MD,  March 05, 2010 4:35 PM  Additional Follow-up for Phone Call Additional follow up Details #1::        called- he would not discuss with me. Offered appt tomorrow with Dr Clent Ridges- he declined. Additional Follow-up by: Raechel Ache, RN,  March 05, 2010 4:50 PM

## 2010-09-12 NOTE — Assessment & Plan Note (Signed)
Summary: hosp f.u last week...em   History of Present Illness Visit Type: Follow-up Visit Primary GI MD: Stan Head MD [facility] Primary Provider: Gershon Crane, MD Requesting Provider: n/a Chief Complaint: Hospital Follow up History of Present Illness:   Patient complains of constant lower abdominal pain and constipation. He states that he has been to the ER several times but still no sucess in having a BM. Patient has taken multiple laxitives and stool softners but he is not taking any currently.  Hospitalized for bipolar problems in [place] in the Fall. The constipation became worse after that. He has had chronic problems but not this bad. He has been to the ED 3 times in December due to constipation-associated pain. He will get relief from defecation but is unable to [facility] defecation.  Last bowel movement was 1 week ago "induced" at the hospital with a dulocolax laxative suppository.    his wife participated in the history-taking.   GI Review of Systems    Reports abdominal pain.     Location of  Abdominal pain: lower abdomen.    Denies acid reflux, belching, bloating, chest pain, dysphagia with liquids, dysphagia with solids, heartburn, loss of appetite, nausea, vomiting, vomiting blood, weight loss, and  weight gain.      Reports constipation and  rectal pain.     Denies anal fissure, black tarry stools, change in bowel habit, diarrhea, diverticulosis, fecal incontinence, heme positive stool, hemorrhoids, irritable bowel syndrome, jaundice, light color stool, liver problems, and  rectal bleeding.    Current Medications (verified): 1)  Atenolol-Chlorthalidone 50-25 Mg  Tabs (Atenolol-Chlorthalidone) .... Every Morning 2)  Ecotrin 325 Mg Tbec (Aspirin) .... Take One By Mouth Once Daily 3)  Tramadol Hcl 50 Mg  Tabs (Tramadol Hcl) .... Take One Tablet By Mouth Once Daily 4)  Risperdal 1 Mg Tabs (Risperidone) .... Once Daily 5)  Depakote 125 Mg Tbec (Divalproex Sodium) .... Take  One By Mouth Once Daily 6)  Sertraline Hcl 50 Mg Tabs (Sertraline Hcl) .... Take One By Mouth At Bedtime  Allergies (verified): 1)  ! * Latex 2)  ! Hydrocodone  Past History:  Past Medical History: Reviewed history from 05/22/2010 and no changes required. GERD Hypertension Diverticulitis, hx of Diverticulosis, colon reflex sympathetic dystrophy left arm Cerebrovascular accident, hx of. Has had two so far IBS, sees Dr. Leone Payor bipolar disorder, sees Dr. Lynnae Sandhoff in [place]  Past Surgical History: Reviewed history from 09/24/2009 and no changes required. colonoscopy 05-08-09 per Dr. Leone Payor, polyps and diverticulosis, repeat in 5 yrs Appendectomy Left Elbow Surgery  x 2 Carpal Tunnel Surgery left arm  Surgery on spine   Family History: Reviewed history from 03/15/2009 and no changes required. Family History of Alcoholism/Addiction Family History Ovarian cancer: Mother  No FH of Colon Cancer: Family History of Celiac Disease:Brother   Social History: Reviewed history from 03/15/2009 and no changes required. Married Alcohol use-yes Patient is a former smoker.  Daily Caffeine Use: 4 cups of coffee daily  Illicit Drug Use - no Patient does not get regular exercise.   Review of Systems       bipolar disorder under better control  Vital Signs:  Patient profile:   70 year old male Height:      70 inches Weight:      169.6 pounds BMI:     24.42 Pulse rate:   56 / minute Pulse rhythm:   regular BP sitting:   90 / 58  (right arm) Cuff size:  regular  Vitals Entered By: Harlow Mares CMA Duncan Dull) (August 14, 2010 4:12 PM)  Physical Exam  Lungs:  Clear throughout to auscultation. Heart:  Regular rate and rhythm; no murmurs, rubs,  or bruits. Abdomen:  soft and nontender Rectal:  slightly firm impaction ? slightly reduced resting tone unable to examine urther due to impaction   Impression & Recommendations:  Problem # 1:  CONSTIPATION  (ICD-564.00) Assessment Deteriorated He has been struggling with this since change in psychiatric medication. he also has known  IBS. Will try regimen of regular MiraLax and as needed dulcolax to try to have defecation at least every 3 days. MiraLax purge first (tomorrow). Follow-up later to reassess with rectal exam again but more involved as I suspect possible disordered defecation/pelvic florr problems given that he reports significant straining to stool. He may need an anorecatl manometry and possible biofeedback training. He has some bladder symptoms also with mainly urinary frequency.  Problem # 2:  IRRITABLE BOWEL SYNDROME (ICD-564.1) Assessment: Deteriorated New regimen for constipation as under constipation. He has had diarrhea in the past but ? if at least more lately when he has had diarrhea at times if it has not been an overflow and/or unloading of stool phenomenon.  Patient Instructions: 1)  Take 4 Dulcolax or Senokot tablets with a large glass of water. One (1) hour later drink Miralax 1 dose (1 packet or 1 tablespoon in 8 oz clear lquid) 6-8 times in 2-3 hours. Do this tomorrow AM. 2)  On a daily basis start Miralax 2 doses 17 grams in 8oz of water. 3)  If no bowel movement in 3 days, use a glycerin suppository  and also 1-2 dulcolax tablets as needed. 4)  Please schedule a follow-up appointment in 4 to 6 weeks.  5)  Copy sent to : Gershon Crane, MD 6)  The medication list was reviewed and reconciled.  All changed / newly prescribed medications were explained.  A complete medication list was provided to the patient / caregiver.

## 2010-09-12 NOTE — Progress Notes (Signed)
Summary: Reaction to meds called in yesterday  Phone Note Call from Patient Call back at Home Phone [phone]   Call For: Dr Leone Payor Summary of Call: Was called in some medicine yesterday and he believes he has an allergic reaction to it. Initial call taken by: Leanor Kail [facility],  July 17, 2010 8:05 AM  Follow-up for Phone Call        Patient concerned because he has been going to the bathroom all night long and still this morning. He wanted to know if he could take something to slow things down. Informed patient that things just need to run their course and return to normal. Instructed patient that he can eat like normal. Follow-up by: Selinda Michaels RN,  July 17, 2010 8:20 AM

## 2010-09-12 NOTE — Assessment & Plan Note (Signed)
Summary: depression/dm   Vital Signs:  Patient profile:   70 year old male Weight:      183 pounds BP sitting:   144 / 90  (left arm) Cuff size:   regular  Vitals Entered By: Raechel Ache, RN (Jan 02, 2010 11:04 AM) CC: C/o abdominal pain and problems with BM's.   History of Present Illness: Here with worsening symptoms of IBS, including alternating diarrhea, bloating, constipation, and cramps. This has become so debilitating that he doesn't want to leave his house unless he has to. He is getting depressed because of how this is taking over his life. He has seen Dr. Leone Payor about this, but now he would like another opinion.   Allergies: 1)  ! * Latex 2)  ! Hydrocodone  Past History:  Past Medical History: Reviewed history from 11/26/2009 and no changes required. GERD Hypertension Diverticulitis, hx of Diverticulosis, colon reflex sympathetic dystrophy left arm Cerebrovascular accident, hx of. Has had two so far IBS, sees Dr. Leone Payor  Past Surgical History: Reviewed history from 09/24/2009 and no changes required. colonoscopy 05-08-09 per Dr. Leone Payor, polyps and diverticulosis, repeat in 5 yrs Appendectomy Left Elbow Surgery  x 2 Carpal Tunnel Surgery left arm  Surgery on spine   Review of Systems  The patient denies anorexia, fever, weight loss, weight gain, vision loss, decreased hearing, hoarseness, chest pain, syncope, dyspnea on exertion, peripheral edema, prolonged cough, headaches, hemoptysis, melena, hematochezia, severe indigestion/heartburn, hematuria, incontinence, genital sores, muscle weakness, suspicious skin lesions, transient blindness, difficulty walking, depression, unusual weight change, abnormal bleeding, enlarged lymph nodes, angioedema, breast masses, and testicular masses.    Physical Exam  General:  Well-developed,well-nourished,in no acute distress; alert,appropriate and cooperative throughout examination Abdomen:  soft, normal bowel sounds, no  distention, no masses, no guarding, no rigidity, no rebound tenderness, no abdominal hernia, no inguinal hernia, no hepatomegaly, and no splenomegaly.  Mildly tender diffusely.    Impression & Recommendations:  Problem # 1:  IRRITABLE BOWEL SYNDROME (ICD-564.1)  Problem # 2:  ANXIETY (ICD-300.00)  His updated medication list for this problem includes:    Trazodone Hcl 100 Mg Tabs (Trazodone hcl) .Marland Kitchen... At bedtime  Complete Medication List: 1)  Atenolol-chlorthalidone 50-25 Mg Tabs (Atenolol-chlorthalidone) .... Every morning 2)  Adult Aspirin Ec Low Strength 81 Mg Tbec (Aspirin) .... Once daily 3)  Imodium Advanced 2-125 Mg Tabs (Loperamide-simethicone) .... As needed 4)  Tylenol With Codeine #3 300-30 Mg Tabs (Acetaminophen-codeine) .Marland Kitchen.. 1 q 6 hours as needed pain 5)  Tramadol Hcl 50 Mg Tabs (Tramadol hcl) .Marland Kitchen.. 1-2 by mouth every 6 hours as needed for pain 6)  Trazodone Hcl 100 Mg Tabs (Trazodone hcl) .... At bedtime 7)  Metronidazole 500 Mg Tabs (Metronidazole) .... Two times a day  Other Orders: Gastroenterology Referral (GI)  Patient Instructions: 1)  Try a course of Flagyl. Will set him up for another GI referral.  Prescriptions: METRONIDAZOLE 500 MG TABS (METRONIDAZOLE) two times a day  #20 x 0   Entered and Authorized by:   [name] MD   Signed by:   [name] MD on 01/02/2010   Method used:   Electronically to        [facility]  [address] [id number] ([facility])       [address]       [place], [place]  [address]       Ph: [phone] or [phone]  Fax: [phone]   RxID:   [id number]

## 2010-09-27 ENCOUNTER — Encounter: Payer: Self-pay | Admitting: Internal Medicine

## 2010-09-27 ENCOUNTER — Ambulatory Visit (INDEPENDENT_AMBULATORY_CARE_PROVIDER_SITE_OTHER): Payer: Medicare Other | Admitting: Internal Medicine

## 2010-09-27 DIAGNOSIS — K589 Irritable bowel syndrome without diarrhea: Secondary | ICD-10-CM

## 2010-09-27 DIAGNOSIS — K5900 Constipation, unspecified: Secondary | ICD-10-CM

## 2010-10-02 NOTE — Assessment & Plan Note (Signed)
Summary: F/u   IBS   History of Present Illness Visit Type: Follow-up Visit Primary GI MD: Stan Head MD [facility] Primary Provider: Gershon Crane, MD Requesting Provider: n/a Chief Complaint: constipation History of Present Illness:   70 yo wm with IBS and constipation problems. Bowels are moving regularly. He has stopped two medications (tramadol and trazadone) since last visit and there is less stress in his life. He had been separated from his wife but she returned to the home and is helping him.  His hemorrhoids are causing problems, he thinks - creating difficulty with cleansing after defecation and using significant toilet paper. Unable to flush wet wipes in his septic system so he wets toilet paper with some success.   GI Review of Systems      Denies abdominal pain, acid reflux, belching, bloating, chest pain, dysphagia with liquids, dysphagia with solids, heartburn, loss of appetite, nausea, vomiting, vomiting blood, weight loss, and  weight gain.        Denies anal fissure, black tarry stools, change in bowel habit, constipation, diarrhea, diverticulosis, fecal incontinence, heme positive stool, hemorrhoids, irritable bowel syndrome, jaundice, light color stool, liver problems, rectal bleeding, and  rectal pain.    Current Medications (verified): 1)  Atenolol-Chlorthalidone 50-25 Mg  Tabs (Atenolol-Chlorthalidone) .... Every Morning 2)  Ecotrin 325 Mg Tbec (Aspirin) .... Take One By Mouth Once Daily 3)  Risperdal 1 Mg Tabs (Risperidone) .... Once Daily 4)  Depakote 125 Mg Tbec (Divalproex Sodium) .... Take Six Tablet By Mouth Once Daily 5)  Sertraline Hcl 50 Mg Tabs (Sertraline Hcl) .... Take One By Mouth At Bedtime 6)  Metamucil 30.9 % Powd (Psyllium) .Marland Kitchen.. 1 Tablespoon Daily  Allergies (verified): 1)  ! * Latex 2)  ! Hydrocodone  Past History:  Past Medical History: GERD Hypertension Diverticulitis, hx of Diverticulosis, colon reflex sympathetic dystrophy left  arm Cerebrovascular accident, hx of. Has had two so far IBS, sees Dr. Leone Payor bipolar disorder, sees Dr. Lynnae Sandhoff in [place] Hemorrhoids  Past Surgical History: Reviewed history from 09/24/2009 and no changes required. colonoscopy 05-08-09 per Dr. Leone Payor, polyps and diverticulosis, repeat in 5 yrs Appendectomy Left Elbow Surgery  x 2 Carpal Tunnel Surgery left arm  Surgery on spine   Family History: Reviewed history from 03/15/2009 and no changes required. Family History of Alcoholism/Addiction Family History Ovarian cancer: Mother  No FH of Colon Cancer: Family History of Celiac Disease:Brother   Social History: Reviewed history from 03/15/2009 and no changes required. Married Alcohol use-yes Patient is a former smoker.  Daily Caffeine Use: 4 cups of coffee daily  Illicit Drug Use - no Patient does not get regular exercise.   Vital Signs:  Patient profile:   70 year old male Height:      70 inches Weight:      184 pounds BMI:     26.50 BSA:     2.02 Pulse rate:   58 / minute Pulse rhythm:   regular BP sitting:   98 / 60  (left arm) Cuff size:   regular  Vitals Entered By: Ok Anis CMA (September 27, 2010 1:12 PM)  Physical Exam  General:  Well developed, well nourished, no acute distress.   Impression & Recommendations:  Problem # 1:  IRRITABLE BOWEL SYNDROME (ICD-564.1) Assessment Improved Much improved. We'll continue fiber supplementation and return as needed.  Problem # 2:  CONSTIPATION (ICD-564.00) Assessment: Improved Moving his bowels regularly without problems now. Continue fiber supplementation.   Patient Instructions: 1)  Please continue current medications.  2)  Try varying the amount of fiber supplement to see if that helps with your anal cleansing problems. 3)  Please schedule a follow-up appointment as needed.  4)  The medication list was reviewed and reconciled.  All changed / newly prescribed medications were explained.  A  complete medication list was provided to the patient / caregiver.

## 2010-10-21 LAB — URINE CULTURE
Colony Count: NO GROWTH
Culture  Setup Time: 201112290117
Culture: NO GROWTH

## 2010-10-21 LAB — DIFFERENTIAL
Basophils Absolute: 0.1 10*3/uL (ref 0.0–0.1)
Lymphocytes Relative: 29 % (ref 12–46)
Monocytes Absolute: 1.1 10*3/uL — ABNORMAL HIGH (ref 0.1–1.0)
Neutro Abs: 5.4 10*3/uL (ref 1.7–7.7)

## 2010-10-21 LAB — URINALYSIS, ROUTINE W REFLEX MICROSCOPIC
Bilirubin Urine: NEGATIVE
Glucose, UA: NEGATIVE mg/dL
Hgb urine dipstick: NEGATIVE
Ketones, ur: NEGATIVE mg/dL
Nitrite: NEGATIVE
Protein, ur: NEGATIVE mg/dL
Specific Gravity, Urine: 1.022 (ref 1.005–1.030)
Urobilinogen, UA: 1 mg/dL (ref 0.0–1.0)
pH: 7.5 (ref 5.0–8.0)

## 2010-10-21 LAB — POCT I-STAT, CHEM 8
BUN: 29 mg/dL — ABNORMAL HIGH (ref 6–23)
Calcium, Ion: 1.14 mmol/L (ref 1.12–1.32)
Chloride: 103 mEq/L (ref 96–112)
Creatinine, Ser: 1.2 mg/dL (ref 0.4–1.5)
Glucose, Bld: 83 mg/dL (ref 70–99)
HCT: 35 % — ABNORMAL LOW (ref 39.0–52.0)
Hemoglobin: 11.9 g/dL — ABNORMAL LOW (ref 13.0–17.0)
Potassium: 3.5 mEq/L (ref 3.5–5.1)
Sodium: 141 meq/L (ref 135–145)
TCO2: 28 mmol/L (ref 0–100)

## 2010-10-21 LAB — HEPATIC FUNCTION PANEL
ALT: 20 U/L (ref 0–53)
AST: 23 U/L (ref 0–37)
Bilirubin, Direct: 0.1 mg/dL (ref 0.0–0.3)

## 2010-10-21 LAB — CBC
HCT: 31.8 % — ABNORMAL LOW (ref 39.0–52.0)
Hemoglobin: 11 g/dL — ABNORMAL LOW (ref 13.0–17.0)
RBC: 3.45 MIL/uL — ABNORMAL LOW (ref 4.22–5.81)
RDW: 14 % (ref 11.5–15.5)
WBC: 9.5 10*3/uL (ref 4.0–10.5)

## 2010-10-22 LAB — CBC
HCT: 32.7 % — ABNORMAL LOW (ref 39.0–52.0)
Hemoglobin: 11.4 g/dL — ABNORMAL LOW (ref 13.0–17.0)
MCH: 31.1 pg (ref 26.0–34.0)
MCHC: 34.9 g/dL (ref 30.0–36.0)
MCV: 89.3 fL (ref 78.0–100.0)

## 2010-10-22 LAB — POCT I-STAT, CHEM 8
Calcium, Ion: 1.15 mmol/L (ref 1.12–1.32)
Glucose, Bld: 104 mg/dL — ABNORMAL HIGH (ref 70–99)
HCT: 37 % — ABNORMAL LOW (ref 39.0–52.0)
Hemoglobin: 12.6 g/dL — ABNORMAL LOW (ref 13.0–17.0)

## 2010-10-22 LAB — DIFFERENTIAL
Basophils Relative: 1 % (ref 0–1)
Eosinophils Absolute: 0.3 10*3/uL (ref 0.0–0.7)
Monocytes Absolute: 1.1 10*3/uL — ABNORMAL HIGH (ref 0.1–1.0)
Monocytes Relative: 14 % — ABNORMAL HIGH (ref 3–12)

## 2010-11-19 LAB — COMPREHENSIVE METABOLIC PANEL
Albumin: 4 g/dL (ref 3.5–5.2)
BUN: 19 mg/dL (ref 6–23)
Chloride: 106 mEq/L (ref 96–112)
Creatinine, Ser: 1.23 mg/dL (ref 0.4–1.5)
Glucose, Bld: 104 mg/dL — ABNORMAL HIGH (ref 70–99)
Total Bilirubin: 0.8 mg/dL (ref 0.3–1.2)

## 2010-11-19 LAB — CBC
HCT: 37 % — ABNORMAL LOW (ref 39.0–52.0)
MCHC: 34.3 g/dL (ref 30.0–36.0)
Platelets: 222 10*3/uL (ref 150–400)
RDW: 14.1 % (ref 11.5–15.5)

## 2010-11-19 LAB — CK TOTAL AND CKMB (NOT AT ARMC)
CK, MB: 4.9 ng/mL — ABNORMAL HIGH (ref 0.3–4.0)
Relative Index: 2.8 — ABNORMAL HIGH (ref 0.0–2.5)

## 2010-11-19 LAB — TROPONIN I: Troponin I: 0.02 ng/mL (ref 0.00–0.06)

## 2010-11-19 LAB — DIFFERENTIAL
Basophils Absolute: 0 10*3/uL (ref 0.0–0.1)
Basophils Relative: 0 % (ref 0–1)
Lymphocytes Relative: 25 % (ref 12–46)
Monocytes Absolute: 0.7 10*3/uL (ref 0.1–1.0)
Neutro Abs: 6.3 10*3/uL (ref 1.7–7.7)
Neutrophils Relative %: 67 % (ref 43–77)

## 2010-11-19 LAB — PROTIME-INR
INR: 1 (ref 0.00–1.49)
Prothrombin Time: 13.2 seconds (ref 11.6–15.2)

## 2010-11-19 LAB — APTT: aPTT: 27 seconds (ref 24–37)

## 2010-12-18 ENCOUNTER — Encounter: Payer: Self-pay | Admitting: Internal Medicine

## 2010-12-18 ENCOUNTER — Ambulatory Visit (INDEPENDENT_AMBULATORY_CARE_PROVIDER_SITE_OTHER): Payer: Medicare Other | Admitting: Internal Medicine

## 2010-12-18 VITALS — BP 128/64 | HR 60 | Ht 72.0 in | Wt 196.0 lb

## 2010-12-18 DIAGNOSIS — F411 Generalized anxiety disorder: Secondary | ICD-10-CM

## 2010-12-18 DIAGNOSIS — R350 Frequency of micturition: Secondary | ICD-10-CM

## 2010-12-18 DIAGNOSIS — M6289 Other specified disorders of muscle: Secondary | ICD-10-CM

## 2010-12-18 DIAGNOSIS — K589 Irritable bowel syndrome without diarrhea: Secondary | ICD-10-CM

## 2010-12-18 NOTE — Assessment & Plan Note (Signed)
In some ways is improved in that he is not severely constipated. However his quality of life is very poor at this time because he has urge incontinence and other episodes of fecal incontinence that make it very difficult to leave the house. He has evidence of pelvic floor dysfunction and disordered defecation on physical exam today. Because of this and his urinary difficulties I will refer to urology for evaluation and consider physical therapy for these problems.  Anorectal manometry couldn't prove useful, await urology evaluation in physical therapist evaluation hopefully coordinated through urology, prior to doing that. I'm not convinced it's absolutely necessary.

## 2010-12-18 NOTE — Progress Notes (Signed)
  Subjective:    Patient ID: John [name], male    DOB: [birth year], 70 y.o.   MRN: [id number]  HPI 70 yo wm with severe IBS. He had become constipated badly in late 2011, early 2012. Laxatives and then fiber helped a great deal. He is back today with with complaints of anal burning. He remembers "tearing" problems in the anal area when he was constipated. He has 20 year history of "leakage", "it feels moist". This has been consistent over time. Small amounts of fecal matter at tomes when he wipes. Last problem with more severe incontinence came when he had defecation while walking back to house from getting mail. Sudden urge with loose stool into underwear. Sometimes cannot distinguish gas from stool. It takes numerous wipes to cleanse anal area and so much so that he will not use public restrooms. The feces coat his anal area, "partially comes out and just stays there". Has some difficulty expelling stool on an intermittent basis. Coffee will trigger a defecation now, as it did in past.  He used to defecate in AM prior to work then hold it all day so he would not go at work but eventually had to do that some.  He has become homebound in most respects due to this and his quality of life is suffering greatly.  Review of Systems + urinary frequency, ? Dysuria and nocturia. + low back pain.    Objective:   Physical Exam Well- developed well-nourished elderly white man in no acute distress There is left hemiparesis Abdomen is soft and nontender Rectal exam shows slight erythema in the perianal area and the anoderm. Anal wink is absent. Digital exam shows slightly decreased resting tone. There is no mass there there is formed stool in the rectum. Prostate feels normal and not completely examine the left lateral position. Voluntary squeeze is reduced, he does contract his muscles when asked to do this. There is no significant abdominal pressure when he does so. When asked to simulate defecation  and expell the finger, there is paradoxical contraction of the gluteal muscles as well as the anal sphincter somewhat with mild abdominal contraction. No significant rectal descent, either.       Assessment & Plan:

## 2010-12-18 NOTE — Assessment & Plan Note (Signed)
Rectal exam demonstrated abnormal voluntary squeeze and simulated defecation with no significant descent today. 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 It may explain some of his bladder difficulties as well. Will refer to urology and likely physical therapy. I don't think he absolutely needs an anorectal manometry but will consider that pending the urology and physical therapy evaluation.

## 2010-12-18 NOTE — Patient Instructions (Addendum)
You have an appointment with Dr Lorin Picket MacDiarmid at [facility] on 01/03/2011 at 3pm You will be mailed new patient paperwork from their office Please fill out and take them along with your insurance cards to your office appointment along with a list of your medications Cc. Jeannett Senior Fry,MD Cc. Scott MacDiarmid,MD

## 2010-12-18 NOTE — Assessment & Plan Note (Signed)
This plays some role in dealing with his IBS but he has significant quality of life issues that may contribute also.

## 2010-12-24 NOTE — Consult Note (Signed)
John [name]             ACCOUNT NO.:  [id number]   MEDICAL RECORD NO.:  [id number]          PATIENT TYPE:  EMS   LOCATION:  [facility]                         FACILITY:  [facility]   PHYSICIAN:  John P. Pearlean Brownie, MD    DATE OF BIRTH:  1940-08-20   DATE OF CONSULTATION:  DATE OF DISCHARGE:                                 CONSULTATION   REFERRING PHYSICIAN:  [name], MD   REASON FOR REFERRAL:  Code stroke.   HISTORY OF PRESENT ILLNESS:  John [name] is a 70 year old gentleman who  developed sudden onset of left facial droop and slurred speech at about  10 a.m. today.  He denies any headache, extremity weakness, gait balance  problems, or numbness.  He does have previous history of right frontal  MCA branch infarct in 2007.  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  A year prior to that, he was found to have bilateral  subdural hematomas, which were thought to be related to frequent falls.  He had undergone bur hole for that with good improvement.  He has also  had chronic left upper extremity pain, weakness, and contractures.  This  was thought to be related to cervical radiculopathy for which he  underwent C-spine surgery, subsequently underwent left ulnar nerve  transposition surgery without improvement, in fact, worsening of his arm  pain.  He was briefly seen in our office and subsequently went to  evaluate Neurology for Botox injections into his forearm and hand  muscles, which provided only temporary relief.   PAST MEDICAL HISTORY:  1. Hypertension.  2. Previous stroke.  3. TIA.  4. Subdural hematoma.  5. Left hand pain and contractures due to radiculopathy.   HOME MEDICATIONS:  1. Atenolol 50 mg a day.  2. Darvocet-N 100 as needed.  3. Ibuprofen 400 twice a day.  4. Metamucil 1 tablet once a day.  5. Multivitamin once a day.   SOCIAL HISTORY:  The patient lives in [place].  He does not smoke or  drink or do drugs.  He is independent in  activities of daily living.   REVIEW OF SYSTEMS:  Negative for any recent fever, cough, chest pain, or  diarrheal illness.  Positive for pain, numbness, weakness, slurred  speech, dysarthria, or facial weakness.   PHYSICAL EXAMINATION:  GENERAL:  A middle-aged Caucasian gentleman who  is currently not in distress and afebrile.  VITAL SIGNS:  Pulse rate is 67 per minute and regular, respiratory rate  15 per minute, and blood pressure 143/81, distal pulses are well felt,  oxygen sats 100% on room air.  HEENT:  Head is nontraumatic.  Head shows bilateral bur hole defects in  frontal regions.  NECK:  Supple.  There is harsh right carotid bruit.  CARDIAC:  Regular heart sounds.  LUNGS:  Clear to auscultation.  NEUROLOGIC:  He is awake, alert, and oriented to time, place, and  person.  There is no aphasia, apraxia, or dysarthria.  Speech appears  quite clear.  Eye movements are full range.  Visual fields are full.  Visual acuity is  adequate.  Face is asymmetric with left lower facial  weakness.  Tongue is midline.  Motor system exam reveals no upper  extremity drift, however, he has significant weakness in intrinsic hand  muscles as well as fixed flexion contractures in the left ulnar fingers.  Left hand movements are painful.  His lower extremity exam reveals  symmetric strength, tone, reflexes.  Plantars are downgoing.  Sensation  is intact.  Coordination is slightly slow on the left, but accurate.  On  NIH stroke scale, he scored only 1.  Modified Rankin 1.   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  No acute abnormalities noted.  Previous MRI scans of the brain from 2006 and 2007 as well as today's CT  scan of the head were reviewed.   IMPRESSION:  A 70 year old gentleman with sudden onset of left lower  facial weakness and slurred speech likely small right subcortical  infarct.  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  The patient has been noncompliant with his stroke medication  and has not followed up in the office for his previous strokes.   PLAN:  Symptoms seem to be resolving and neurological deficits are quite  minimum, hence we will not consider him for thrombolysis with TPA.  The  patient will benefit with antiplatelet therapy as well as aggressive  risk factor modification with control of hypertension with systolic  blood pressure goal below 130.  Check lipid profile and hemoglobin A1c.  Check MRI scan of the brain.  The patient may be admitted if he is  willing for stroke workup and promises to be compliant with stroke  followup.  If, however, he wishes to go home, this workup can be done as  an outpatient, but he needs close neurological followup after discharge.  If the patient is compliant with followup plan, may consider changing  aspirin to Aggrenox in the future.           ______________________________  Sunny Schlein. Pearlean Brownie, MD     PPS/MEDQ  D:  12/20/2008  T:  12/21/2008  Job:  [id number]

## 2010-12-27 NOTE — Discharge Summary (Signed)
John John [name], John [name] NO.:  [id number]   MEDICAL RECORD NO.:  [id number]          PATIENT TYPE:  INP   LOCATION:  3034                         FACILITY:  [facility]   PHYSICIAN:  Cristi Loron, M.D.DATE OF BIRTH:  1940/08/27   DATE OF ADMISSION:  04/25/2005  DATE OF DISCHARGE:  05/06/2005                                 DISCHARGE SUMMARY   HISTORY OF PRESENT ILLNESS:  The patient is a 70 year old white male who  suffered from headaches and has had trouble with walking.  He was worked up  with a brain MRI which demonstrated bilateral subdural hematomas.  I was  consulted and recommended the patient undergo bilateral craniotomies for  evacuation of the bilateral subdural hematomas.  The patient and his family  have weighed the risks, benefits, and alternatives of surgery and have  decided to proceed with the operation.   For further details of this admission, please refer to typed history and  physical.   HOSPITAL COURSE:  I admitted the patient to [facility] on April 25, 2005, with diagnosis of bilateral subdural hematomas.  On the day of  admission, I performed bilateral frontoparietal craniotomies for evacuation  of subdural hematomas and placement of bilateral subdural Jackson-Pratt  drains.  The surgery went well.  For details of this operation, please refer  to the operative note.  The patient's postoperative course was initially  unremarkable.  He, however, began to have seizures on postoperative day one  and, in fact, had status epilepticus.  The patient was treated medically and  we had Dr. Jacki Cones see the patient, he made some recommendations and  handled the patient's seizures medically.  We did reintubate the patient as  the seizures were not abating.  The patient was subsequently extubated on  April 27, 2005.  The patient's neurological status improved.  He had no  more seizures.  I removed the Jackson-Pratt drains.  The  patient was  transferred to the acute care unit then to the neurosurgical general floor.  On the floor, the patient's recovery was unremarkable except that he had a  bout of right arm phlebitis.  He was treated with antibiotics.  We had  physical therapy and occupational therapy see the patient as well as the  rehab team.  They thought he would be better off discharged to home with  home PT and OT.  This was arranged.  By May 06, 2005, the patient was  afebrile, wounds were healing well, he was eating well, ambulating well.  He  was neurologically normal except he had some weakness in his left upper  extremity which is a chronic baseline state.  At this point, he was  discharged home on May 06, 2005.   DISCHARGE INSTRUCTIONS:  The patient was given written discharge  instructions.   DISCHARGE MEDICATIONS:  The patient was given prescription for Dilantin 100  mg, number 90, 1 p.o. t.i.d., Lortab 10, number 50, 1 p.o. q.4h. p.r.n.  pain.   FINAL DIAGNOSIS:  1.  Status epilepticus.  2.  Bilateral subdural hematomas.   PROCEDURE PERFORMED:  Bilateral craniotomy for evacuation of subdural  hematomas.      Cristi Loron, M.D.  Electronically Signed     JDJ/MEDQ  D:  08/14/2005  T:  08/14/2005  Job:  [id number]

## 2010-12-27 NOTE — Consult Note (Signed)
John [name], John [name]             ACCOUNT NO.:  [id number]   MEDICAL RECORD NO.:  [id number]          PATIENT TYPE:  INP   LOCATION:  3110                         FACILITY:  [facility]   PHYSICIAN:  Casimiro Needle L. Reynolds, M.D.DATE OF BIRTH:  09/01/1940   DATE OF CONSULTATION:  04/26/2005  DATE OF DISCHARGE:                                   CONSULTATION   REASON FOR CONSULTATION:  Status epilepticus following subdural hematoma  evacuation.   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 The patient presented to his primary care  physician over the last few months with repeated falls, complaints of  generalized weakness, and difficulty with walking. He had a brain MRI that  demonstrated a large subdural hematoma with mass effect on the left. He had  a smaller subdural on the right. He was admitted yesterday and Dr. Lovell Sheehan  performed evacuations of both subdurals. The patient had had an uneventful  postoperative course until this morning. He was described as awake, alert,  and cooperative with examinations and a CT scan from this morning  demonstrated pneumocephalus but no significant residual blood. This morning  at about 8 o'clock, when the technician went in to take his vital signs, she  found him having a generalized seizure. The patient was given 2 mg of Ativan  and according to the RN report, no longer had generalized seizure though  persisted with rhythmic twitching movements of the left arm. This persisted  in despite of aggressive medications. The patient was given a total of 9 mg  of intravenous Ativan and is also receiving a load of intravenous phenytoin.  The left arm feeling, however, continues. There  is no known previous history  of seizure. Dr. Lovell Sheehan has requested neurologic evaluation for ongoing  treatment of his seizure issues.   PAST MEDICAL HISTORY:  As above. Also has history of hypertension. He has  had an anterior cervical diskectomy and fusion by Dr. Jordan Likes in the past.   FAMILY HISTORY/SOCIAL HISTORY/REVIEW OF SYSTEMS:  As outlined in the  admission history and physical from yesterday, which is reviewed.   MEDICATIONS:  Prior to admission, he was reportedly taking atenolol, HCTZ,  and Paxil, along with Demerol for pain. His present list of medications  includes HCTZ 25 mg daily, intravenous Pepcid, and Ancef. Colace b.i.d. and  p.r.n.'s for pain and hypertension and nausea. He also receives seizure  medicines as per the HPI.   PHYSICAL EXAMINATION:  VITAL SIGNS:  Temperature 97.5, blood pressure  140/95, heart rate 60, respiratory rate 15. O2 saturation 98% on room air.  GENERAL:  This is an ill-appearing man lying supine in the hospital bed in  no evident distress.  HEENT:  Head is normocephalic. He has bilateral subdural drains in place and  the head is well bandaged.  NECK:  Supple without carotid bruits.  HEART:  Regular rate and rhythm. Without murmurs.  NEUROLOGIC:  Mental status, he is deeply obtunded to the point of coma. He  does not demonstrate any meaningful response to verbal stimuli and makes no  effort at spontaneous speech and responds only very feebly to deep pain.  Cranial nerves, pupils are 4 mm and briskly reactively to 3. Voluntary  extraocular movements cannot be assessed. He does have an intact  oculocephalic corneal and gag refluxes. Motor, normal bulk and tone. No real  withdrawal to pain in the upper extremities. He demonstrates briskly  responses to painful stimulation in the lower extremities. Sensory, see  motor. Reflexes generally diminished throughout. Toes are upgoing  bilaterally.   LABORATORY DATA:  CT of the head from this morning is  personally reviewed  and is as above.   CBC:  White count 11.3. Hemoglobin 12.2. Platelets 258,000 from this  morning. White count from yesterday was 9.9 with hemoglobin of 14.1. B-met  from this morning:  Sodium 136, glucose a little elevated at 176, otherwise,  unremarkable.   IMPRESSION:  Focal status epilepticus with some generalized seizures  following evacuation of subdural hematoma yesterday. He has ongoing focal  motor activity.   PLAN:  Will complete the intravenous Dilantin load. If that does not stop  the seizures, will load intravenous Keppra 1 gram and possibly another gram,  depending on is response. Will continue with his other medications and  follow along his clinical response. He will need an EEG on Monday. We will  follow.   Thank you for the consultation.      Michael L. Thad Ranger, M.D.  Electronically Signed     MLR/MEDQ  D:  04/26/2005  T:  04/26/2005  Job:  [id number]   cc:   Jeannett Senior A. Clent Ridges, M.D. [facility]  [address] [place]  [place] [address]

## 2010-12-27 NOTE — Procedures (Signed)
EEG NUMBER:  [id number].   HISTORY:  This is a 70 year old patient with bilateral subdural hematoma  status post evacuation. The patient has had seizure type events. The patient  is being evaluated for the seizures.   EEG CLASSIFICATION:  Essentially normal awake and asleep.   DESCRIPTION:  According to the background rhythms, this recording consists  of a somewhat poorly modulated 9 hertz background activity that is reactive  to eye opening and closure.  As the record progresses, there appears to be  some head movement and muscle artifact off and on during the recording.  During periods of wakefulness, background rhythm activities are quite  symmetric and unremarkable from one side to the next. Towards the end of the  recording, the patient seems to be drift off into early stage II sleep with  rudimentary sleep spindles and K complexes seen. At no time during the  recording does there appear to be evidence of actual spike or spike wave  discharges or evidence of focal slowing. No photic stimulation or  hyperventilation was performed. EKG monitor shows no evidence of cardiac  rhythm abnormalities although an occasional irregular heart rhythm was  noted. Rate is 90.   IMPRESSION:  This is an essentially normal EEG recording in the awake and  drowsy state. No evidence of ictal or interictal discharges are seen.      Marlan Palau, M.D.  Electronically Signed     ZOX:WRUE  D:  04/28/2005 15:59:21  T:  04/29/2005 08:35:52  Job #:  [id number]

## 2010-12-27 NOTE — Op Note (Signed)
NAMEELIBERTO, John NO.:  [id number]   MEDICAL RECORD NO.:  [id number]          PATIENT TYPE:  INP   LOCATION:  3110                         FACILITY:  [facility]   PHYSICIAN:  Cristi Loron, M.D.DATE OF BIRTH:  02/28/1941   DATE OF PROCEDURE:  04/25/2005  DATE OF DISCHARGE:                                 OPERATIVE REPORT   BRIEF HISTORY:  The patient is a 70 year old white male who has suffered  from headaches and with trouble walking.  He was worked up with a brain MRI  which demonstrated bilateral subdural hematomas.  I was consulted and  recommended the patient undergo a bilateral craniotomy for evacuation of the  subdural hematomas.  The patient and his family has weighed the risks,  benefits, and alternatives of surgery and decided to proceed with the  operation.   PREOPERATIVE DIAGNOSIS:  Bilateral subdural hematomas.   POSTOPERATIVE DIAGNOSIS:  Bilateral subdural hematomas.   PROCEDURE:  Bilateral frontoparietal craniotomies for evacuation of subdural  hematomas and placement of bilateral subdural Jackson-Pratt drains.   SURGEON:  Cristi Loron, M.D.   ASSISTANT:  Hilda Lias, M.D.   ANESTHESIA:  General endotracheal.   ESTIMATED BLOOD LOSS:  100 cc.   SPECIMENS:  None.   DRAINS:  Two subdural Jackson-Pratt drains.   COMPLICATIONS:  None.   DESCRIPTION OF PROCEDURE:  The patient was brought to the operating room by  the anesthesia team.  General endotracheal anesthesia was induced.  The  patient remained in the supine position.  The Mayfield three-point head rest  was applied to the patient's scalp area.  We positioned the patient in a  lounge chair-type position and with his looking straight up at the ceiling,  the patient's scalp was then shaved with the clippers and then prepared with  Betadine scrub and Betadine solution, and sterile drapes were applied.  In  then injected the area to be incised with Marcaine with epinephrine  solution  and used the scalpel to make a linear incision in the bilateral parietal  regions.  We used a cerebellar retractor for exposure.  We used a periosteal  elevator to expose the underlying calvarium.  We then used the high-speed  drill to perform bilateral bur holes.  Then we used the footplate device to  create a small craniotomy flap bilaterally.  We elevated the craniotomy  flaps with the Penfield #1 exposing the underlying dur.  We then incised the  dura with the scissors.  We did this bilaterally.  This exposed the  underlying subdural membranes.  We coagulated the subdural membranes with  bipolar electrocautery and incised it with a 15-mm scalpel, and then there  was release of chronic subdural hematoma fluid under high pressure.  The  patient had some more acute gelatinous blood in his subdural space.  We  removed it with suction and irrigation.  There was a lower subdural membrane  overlying the brain.  We coagulated this and communicated the subdural  compartments by removing some lower subdural membrane.  We then copiously  irrigated the wound out with saline solution until it came  back crystal  clear.  We removed all the visible subdural hematoma.  We did not identify  any distinct bleeding points.  We obtained stringent hemostasis using  bipolar cautery.  We then placed 10-mm flat Jackson-Pratt drains in the  subdural hematoma, tunneled them out through a separate stab incision, and  then we reapproximated the patient's dura loosely with interrupted 4-0  Nurolon suture.  We then replaced the patient's the craniotomy flaps with  titanium mini-plates and screws.  We then removed the cerebellar retractors  and then reapproximated the patient's galea with interrupted 2-0 Vicryl a  suture and the skin with stainless steel staples.  The wounds were then  cleansed, and bacitracin ointment was applied to the patient's wounds and  the drain exit sites.  A sterile dressing was  applied. The drapes were  removed, and the patient was subsequently extubated by the anesthesia team  and transported to the post-anesthesia care unit in stable condition.  All  sponge, instrument, and needle counts were correct at the end of the case.      Cristi Loron, M.D.  Electronically Signed     JDJ/MEDQ  D:  04/25/2005  T:  04/26/2005  Job:  [id number]

## 2010-12-27 NOTE — Op Note (Signed)
[place]  Patient:    GARRIS, MELHORN Visit Number: [id number] MRN: [id number]          Service Type: DSU Location: [facility] Attending Physician:  Donn Pierini Proc. Date: 04/01/01 Adm. 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  OPERATION PERFORMED:  Left ulnar nerve release at the elbow.  SURGEON:  Julio Sicks, M.D.  ANESTHESIA:  General endotracheal.  INDICATIONS FOR PROCEDURE:  The patient is a 70 year old male with a history of a left-sided ulnar neuropathy failing conservative management.  EMGs and nerve conduction studies were positive for slowing at the elbow.  The patient has been counseled as to his options.  He has decided to proceed with a left-sided ulnar nerve release.  He has no prior history of trauma.  DESCRIPTION OF PROCEDURE:  The patient was taken to the operating room and placed on the operating table in supine position.  After an adequate level of anesthesia was achieved, the patients left upper extremity was prepped and draped sterilely.  A curvilinear incision was then made along the medial epicondyle on the left side.  This was carried down sharply through soft tissues.  The ulnar nerve was identified proximally to the ulnar groove.  It was then decompressed along its course to the ulnar groove.  The flexor carpi ulnaris muscle was identified.  The aponeurosis was identified.  The two heads were then divided further decompressing the nerve.  At this point the ulnar nerve was well decompressed. There was no evidence of injury to the ulnar nerve.  The wound was then copiously irrigated with antibiotic solution.  It was then closed in typical fashion.  A sterile dressing was applied.   There were no apparent complications.  The patient tolerated the procedure well and returned to the recovery room postoperatively. Attending Physician:  Donn Pierini DD:  04/01/01 TD:  04/01/01 Job: [id number]

## 2010-12-27 NOTE — Consult Note (Signed)
NAMESHIN, LAMOUR NO.:  [id number]   MEDICAL RECORD NO.:  [id number]          PATIENT TYPE:  INP   LOCATION:  1830                         FACILITY:  [facility]   PHYSICIAN:  Cristi Loron, M.D.DATE OF BIRTH:  1941-04-15   DATE OF CONSULTATION:  DATE OF DISCHARGE:                                   CONSULTATION   CHIEF COMPLAINT:  I can't walk.   HISTORY OF PRESENT ILLNESS:  The patient is a 70 year old white male who  presents with a complicated history.  He is an old patient of Dr. Lindalou Hose and  Dr. Jordan Likes performed a C6-C7 anterior cervical discectomy, fusion, and plating  back in 2000.  The patient has had some persistent trouble with his left  arm.  He subsequently saw Dr. Mina Marble and underwent a recent left ulnar  nerve transposition and left carpal tunnel release.  The patient, after that  surgery, had a lot of swelling and a lot of pain in his arm.  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  The  patient was referred to Dr. Isaiah Serge who performed some stellate ganglion  blocks and a total of nine did not seem to help.  In fact, the patient says  that over the last several weeks, he has had a worsening clinical situation.  He describes some weakness with dragging of his left leg, some trouble  walking.  He, over the last week, has noted some trouble with reading  describing dyslexia.  He has also complained of some generalized weakness.  He saw his primary doctor, Dr. Clent Ridges, and was worked up with a brain MRI.  This demonstrated bilateral subdural hematomas.  The patient was then  instructed to go to the emergency department and Dr. Clent Ridges has kindly asked me  to see the patient.   The patient presently admits to headaches going on for the last several  months.  He admits to taking several falls, one in which it sounds like he  had some head trauma but no loss  of consciousness.  There are no seizures,  nausea,  vomiting.   PAST MEDICAL HISTORY:  Positive for hypertension, cervical ruptured disc,  reflex sympathetic dystrophy, ulnar neuropathy, median neuropathy, remote  history of appendicitis, tonsillitis.   PAST SURGICAL HISTORY:  As above, anterior cervical discectomy, fusion, and  plating, left ulnar nerve release, left carpal tunnel release.   MEDICATIONS PRIOR TO ADMISSION:  Atenolol, hydrochlorothiazide, Demerol,  Paxil.   ALLERGIES:  The patient has no known drug allergies but does have an allergy  to latex.   FAMILY HISTORY:  The patient's mother died at 6 of ovarian cancer, the  patient's father is age 48, he is demented in a nursing home.   REVIEW OF SYSTEMS:  Negative except as above.   PHYSICAL EXAMINATION:  GENERAL:  Pleasant 70 year old white male on a stretcher in no apparent  distress.  HEENT:  Normocephalic, atraumatic, pupils are equal, round, and reactive to  light, extraocular movements intact, oropharynx is benign.  NECK:  Supple, there are no masses, deformities, tracheal deviation.  He has  a normal range of motion given his age.  THORAX:  Symmetric.  LUNGS:  Clear to auscultation.  HEART:  Regular rate and rhythm.  ABDOMEN:  Soft nontender.  EXTREMITIES:  The patient does have some atrophic changes in his left hand  with loss of hair, somewhat contracted left hand.  NEUROLOGICAL:  The patient is alert and oriented x 2.  Cranial nerves 2-12  were examined bilaterally and are grossly normal bilaterally.  Vision and  hearing grossly normal bilaterally.  Motor exam demonstrates that the  patient has diffuse weakness, approximately 4+/5 strength in his bilateral  biceps, triceps, hand grip, psoas, gastrocnemius.  He seems somewhat weaker  in his left upper extremity than he is elsewhere.  The patient's sensory  exam is normal to light touch and sensation in all tested except in his left  ulnar nerve distribution which he has some decreased sensation.   Cerebellar  function is intact to rapid, alternating movements of the upper extremity on  the right, he has limited use of his left upper extremity.  Deep tendon  reflexes are 1/4 in his bilateral biceps, triceps, brachialis, 2/4 in  bilateral quadriceps, 1/4 in bilateral gastrocnemius.  He has no ankle  clonus.   IMAGING STUDIES:  I have reviewed the patient's brain MRI performed without  contrast at [facility] on April 25, 2005, and this demonstrates  the patient has a large left subdural hematoma with approximately 1 cm of  left to right midline shift.  He also has a moderate size right subdural  hematoma.   ASSESSMENT AND PLAN:  Bilateral subdural hematomas.  I have discussed the  situation with the patient and his wife.  I told him he has bilateral  subdural hematomas creating significant mass affect on his brain.  I have  discussed the various treatment options with him including doing nothing,  observation, and surgery.  I have discussed the surgery option of a  bilateral craniotomy for evacuation of subdural hematoma.  I have discussed  the risks of surgery including the risks of anesthesia, hemorrhage,  infection, seizures, stroke, death, recurrent subdural hematoma, etc.  I  have answered all the patient's questions.  He wants to proceed with surgery  and we are going to do this today as soon as possible.   Left arm and hand pain I have discussed with them, as well.  I told  him  that he should view this as a separate issue as this has nothing to do with  the subdural hematomas and he needs to continue to follow up with his  current doctors for that.      Cristi Loron, M.D.  Electronically Signed     JDJ/MEDQ  D:  04/25/2005  T:  04/25/2005  Job:  [id number]   cc:   Jeannett Senior A. Clent Ridges, M.D. [facility]  [address] [place]  [place] [address]

## 2010-12-27 NOTE — Consult Note (Signed)
NAMEKRISTON, PASQUARELLO             ACCOUNT NO.:  [id number]   MEDICAL RECORD NO.:  [id number]          PATIENT TYPE:  AMB   LOCATION:  SDS                          FACILITY:  [facility]   PHYSICIAN:  Marin Roberts, MDDATE OF BIRTH:  06/15/1941   DATE OF CONSULTATION:  DATE OF DISCHARGE:  07/04/2005                                   CONSULTATION   Dear Dr. Madilyn Fireman:   Thank you for sending the intracranial images of Mr. Spinnato' recent  carotid angiogram.   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  There is no opacification of the right A1 segment or  right middle cerebral artery.  The bilateral ACA and left MCA vessel  demonstrate no significant stenosis, aneurysm, or branch vessel occlusion.   There were no right-sided intracranial injections.  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   On the MRI, there appeared to be reconstitution of the right internal  carotid artery, although it was unclear from where the reconstitution  occurred.   IMPRESSION:  1.  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  2.  The right middle cerebral artery vessels are not visualized on this      injection and were not imaged otherwise.   Thank you again for sending these images.  If I can be of any additional  assistance, please do not hesitate to call me.           ______________________________  Marin Roberts, MD     CM/MEDQ  D:  07/07/2005  T:  07/07/2005  Job:  [id number]

## 2010-12-27 NOTE — Op Note (Signed)
NAMEMYCHAEL, SMOCK             ACCOUNT NO.:  [id number]   MEDICAL RECORD NO.:  [id number]          PATIENT TYPE:  AMB   LOCATION:  DSC                          FACILITY:  [facility]   PHYSICIAN:  Artist Pais. Weingold, M.D.DATE OF BIRTH:  20-Feb-1941   DATE OF PROCEDURE:  07/17/2004  DATE OF DISCHARGE:                                 OPERATIVE REPORT   PREOPERATIVE DIAGNOSIS:  1.  Left carpal tunnel syndrome.  2.  Left recurrent cubitale syndrome.   POSTOPERATIVE DIAGNOSIS:  1.  Left carpal tunnel syndrome.  2.  Left recurrent cubitale syndrome.   PROCEDURE:  1.  Left carpal tunnel release.  2.  Left elbow subcutaneous transposition ulnar nerve.   SURGEON:  Artist Pais. Mina Marble, M.D.   ASSISTANT:  Aura Fey. Bobbe Medico.   ANESTHESIA:  General.   TOURNIQUET TIME:  One hour.   COMPLICATIONS:  None.   DRAINS:  None.   DESCRIPTION OF PROCEDURE:  The patient was taken to the operating room.  With the induction of adequate general anesthesia, left upper extremity was  prepped and draped in the sterile fashion.  Esmarch was used to exsanguinate  the limb.  The tourniquet was to 250 mmHg.  At this point and time, a 2 cm  incision was made at the palmar aspect of the left hand in line with the  long finger starting at _crdinal line.  The incision was taken down through  the skin and subcutaneous tissues.  The palmar fascia was identified and  split.  The distal edge of the transverse carpal ligament was identified and  split with 15 blade.  The median nerve was identified and protected with  freer elevator.  The remaining aspect of the transverse carpal ligament was  divided under direction vision using the curved blunt scissors.  The canal  was inspected.  The interosseous and ganglion was present, irrigated, and  loosely closed with a running 3-0 Prolene subcuticular stitch.  Steri-  Strips, 4 by 4's, fluffs, and compressive dressing was applied.   A second incision was made on the  medial aspect of the right elbow somewhere  between the medial epicondyle and olecranon process using an old anterior  medial incision extending proximally and distally in S-type fashioned.  Flaps were raised accordingly.  Care was taken to identify and try and  maintain branches of the medial and brachial cutaneous nerve.  Once this was  done, the ulnar nerve was identified proximal in the antecubital tunnel and  once this was done it was traced into the antecubital tunnel and this was  released.  The fascia overlying the flexor carpal ulnaris muscle was  incised, decompressing the nerve all the way to the length of the  bifurcation of the FCU heads.  Proximally, the intramuscular septum was  identified and resected.  The nerve was completely decompressed for an 8 cm  distance.  It was then carefully transposed anteriorly and placed under a  large subcutaneous sling made out of a large amount of fat in the anterior  medial aspect of the elbow.  This was done using 3-0  Vicryl.   The wound was then thoroughly irrigated.  Hemostasis was achieved with  bipolar cautery, loosely closed with a running 3-0 Prolene subcuticular  stitch.  Steri-Strips, a 4 by 4 fluff, and a posterior splint of plaster was  applied.  The patient tolerated the procedure well.      Matt   MAW/MEDQ  D:  07/17/2004  T:  07/17/2004  Job:  [id number]

## 2010-12-27 NOTE — Op Note (Signed)
NAMESHEA, KAPUR             ACCOUNT NO.:  [id number]   MEDICAL RECORD NO.:  [id number]          PATIENT TYPE:  AMB   LOCATION:  SDS                          FACILITY:  [facility]   PHYSICIAN:  Balinda Quails, M.D.    DATE OF BIRTH:  [birth year]   DATE OF PROCEDURE:  DATE OF DISCHARGE:                                 OPERATIVE REPORT   SURGEON:  Denman George, MD.   DIAGNOSIS:  Extracranial cerebrovascular occlusive disease.   PROCEDURE:  1.  Arch aortogram.  2.  Innominate right subclavian arteriogram.  3.  Selective left carotid arteriogram.   ACCESS:  Right common femoral artery 5-French sheath.   CONTRAST:  100 mL Visipaque.   COMPLICATIONS:  None apparent   CLINICAL NOTE:  John [name] is a 70 year old male with a history of  bilateral subdural hematomas. These were drained by bilateral craniotomies.  During recent followup he was noted to have a right frontal stroke. Workup  for this including carotid Doppler evaluation revealed right carotid  occlusive disease. He has undergone MR angiography along with carotid  Doppler and there was a question as to possible occlusion of the right  common carotid artery. This is as opposed to possible severe stenosis. He is  brought to the cath lab, at this time, for further workup for diagnostic  arteriography.   PROCEDURE NOTE:  The patient was brought to the cath lab in stable  condition; placed in supine position. Both groins prepped and draped in a  sterile fashion. Right groin instilled with 1% Xylocaine. Needle was easily  induced into the right common femoral artery. A 0.035 Wholey guidewire  advanced through the needle into the mid abdominal aorta. The needle  removed, and a 5-French sheath advanced over the guidewire. Dilator removed  and the sheath flushed with heparin saline solution.   The guidewire was then advanced up into the aortic arch. A 40 degree LAO  projection arch view obtained. A pigtail catheter  advanced over the  guidewire to the ascending aorta. A 40-degree LAO projection arch aortogram  obtained. This revealed a bovine aortic arch. The innominate artery was  widely patent. The left subclavian artery revealed a mild proximal stenosis.  The left vertebral artery was dominant and large in caliber. The right  vertebral artery was patent with a proximal stenosis. The right common  carotid artery appeared to be occluded just beyond its origin. The left  common carotid artery was widely patent.   The guidewire exchange then made for an H1 catheter. The H1 catheter was  then engaged into the innominate artery. Guidewire passed into the  innominate artery; and the H1 catheter advanced over the guidewire. A 30-  degree RAO projection view of the innominate artery revealed occlusion of  the right common carotid artery at its origin. There was a calcified ring-  like stenosis of the origin of the right subclavian artery approximately 50%  stenosis. There appeared to be a stenosis of the proximal right vertebral  artery. To obtain further view of this, the guidewire was reinserted, the  guidewire then passed into  the right subclavian artery. The H1 catheter was  advanced into the proximal right subclavian artery. Right subclavian  arteriography obtained and this delineated the origin of the right vertebral  artery. The right vertebral artery did reveal a proximal stenosis. This is  estimated to be 50%. There was antegrade flow present.   The H1 catheter was then drawn back into the aortic arch. An exchange then  made for a Simmons 1 catheter. The Simmons 1 catheter was engaged into the  left common carotid artery. Cervical left carotid arteriography obtained  along with intracranial views. Cervical left carotid arteriography revealed  a widely patent left common carotid artery. Left external carotid artery was  also widely patent. Left internal carotid artery revealed plaque at its   origin with approximately a 30-50% stenosis. There was mild ulceration  present.   Intracranial views were obtained with a left carotid injection; and these  will be dictated under a separate heading by neuroradiology.   This completed the arteriogram procedure. The Simmons 1 catheter backed out  of the left common carotid origin. The guidewire reinserted catheter and  guidewire removed. Right femoral sheath removed. No apparent complications.  Total contrast a 100 mL of Visipaque.   FINAL IMPRESSION:  1.  Bovine aortic arch.  2.  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  3.  A 50% proximal right vertebral stenosis.  4.  A 30-50% proximal right subclavian stenosis.  5.  Mild proximal left subclavian stenosis.  6.  Mild-to-moderate right internal carotid artery stenosis.   DISPOSITION:  These results been reviewed with the patient. The patient is  not felt to require vascular surgical intervention at this time. Discharged  for further scheduled follow-up.      Balinda Quails, M.D.  Electronically Signed     PGH/MEDQ  D:  07/04/2005  T:  07/04/2005  Job:  [id number]   cc:   Cristi Loron, M.D.  Fax: [phone]   Pramod P. Pearlean Brownie, MD  Fax: [phone]   Cath Lab [facility]

## 2010-12-27 NOTE — H&P (Signed)
NAMECLIDE, REMMERS NO.:  [id number]   MEDICAL RECORD NO.:  [id number]          PATIENT TYPE:  EMS   LOCATION:  MAJO                         FACILITY:  [facility]   PHYSICIAN:  Cristi Loron, M.D.DATE OF BIRTH:  1941-06-10   DATE OF ADMISSION:  04/25/2005  DATE OF DISCHARGE:                                HISTORY & PHYSICAL   Audio too short to transcribe (less than 5 seconds)      Cristi Loron, M.D.     JDJ/MEDQ  D:  04/25/2005  T:  04/25/2005  Job:  [id number]

## 2011-06-20 ENCOUNTER — Telehealth: Payer: Self-pay | Admitting: *Deleted

## 2011-06-20 MED ORDER — ATENOLOL-CHLORTHALIDONE 50-25 MG PO TABS: 1.0000 | ORAL_TABLET | Freq: Every day | ORAL | Status: DC

## 2011-06-20 NOTE — Telephone Encounter (Signed)
rx sent in electronically 

## 2011-06-20 NOTE — Telephone Encounter (Signed)
Refill request ATENOLOL-CHLORTHAL 50-TB # 30 x 4

## 2011-10-04 IMAGING — CR DG ABDOMEN 1V
1 series · 1 of 1 positions shown · non-contrast
Comparison: none

REASON FOR EXAM: constipation pain
COMMENTS:

[view not recorded]
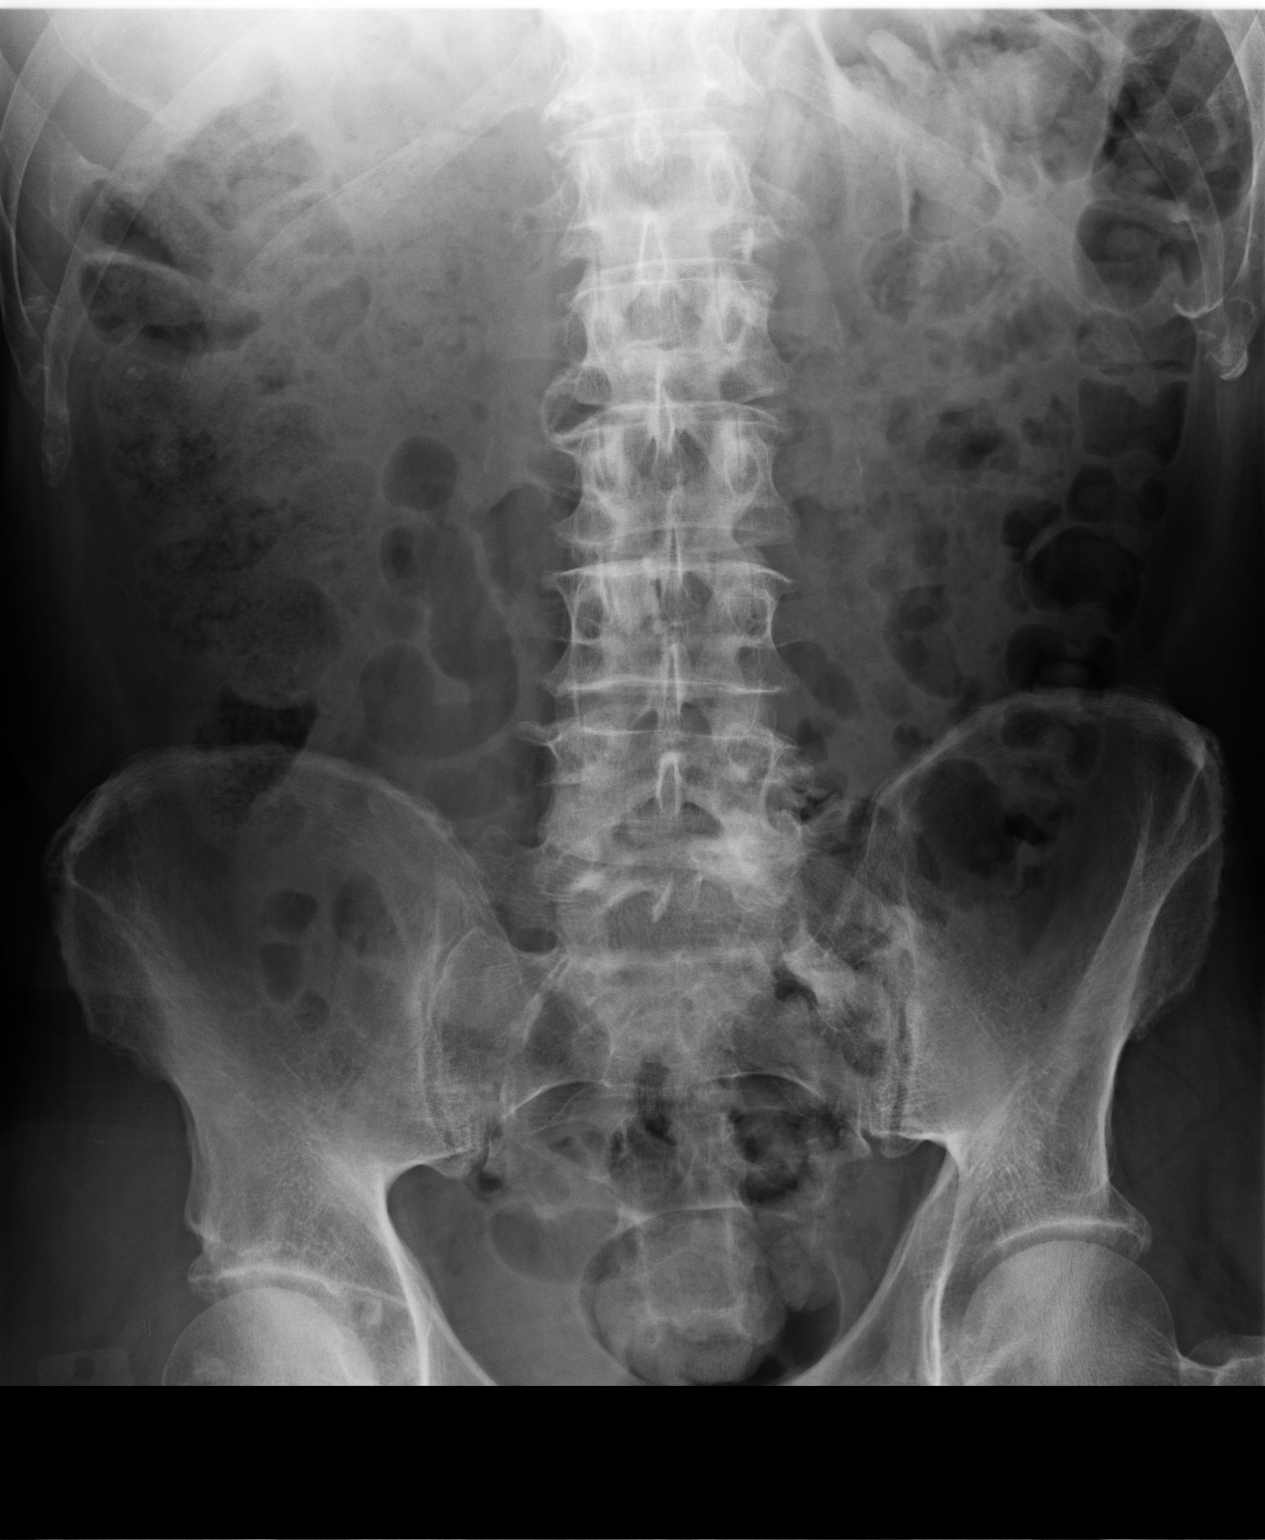

[1 of 1 positions shown; findings below may reference images not displayed]

PROCEDURE:     DXR - DXR KIDNEY URETER BLADDER  - July 26, 2010  [DATE]

RESULT:     An AP view of the abdomen reveals gas in the large and small
bowel. No significantly dilated loops of bowel are seen. No bowel
obstruction is noted. No abnormal intraabdominal calcifications are seen.
There is a large amount of fecal material in the colon, particularly the
ascending and transverse colon. No acute bony abnormalities are seen.
IMPRESSION: 1. No bowel obstruction is seen.
2. There is a moderately large amount of fecal material in the colon.

## 2011-11-17 ENCOUNTER — Encounter: Payer: Self-pay | Admitting: Internal Medicine

## 2011-11-17 ENCOUNTER — Telehealth: Payer: Self-pay

## 2011-11-17 ENCOUNTER — Ambulatory Visit (INDEPENDENT_AMBULATORY_CARE_PROVIDER_SITE_OTHER): Payer: Medicare Other | Admitting: Internal Medicine

## 2011-11-17 VITALS — BP 110/70 | HR 68 | Ht 70.0 in | Wt 196.0 lb

## 2011-11-17 DIAGNOSIS — K589 Irritable bowel syndrome without diarrhea: Secondary | ICD-10-CM

## 2011-11-17 DIAGNOSIS — M6289 Other specified disorders of muscle: Secondary | ICD-10-CM

## 2011-11-17 NOTE — Assessment & Plan Note (Signed)
Tertiary referral was made. He may end up with an anorectal manometry to better characterize his problems. I have explained this to him.

## 2011-11-17 NOTE — Assessment & Plan Note (Signed)
Urgent continence issues remaining. I think this is tied in with his pelvic floor dysfunction perhaps. Tertiary referral will be made.

## 2011-11-17 NOTE — Telephone Encounter (Signed)
Patient is advised of the appt date and time. He is scheduled to see .Thorne at [facility] 02/16/12 8:00

## 2011-11-17 NOTE — Telephone Encounter (Signed)
Message copied by Annett Fabian on Mon Nov 17, 2011  2:31 PM ------      Message from: Stan Head E      Created: Mon Nov 17, 2011 12:25 PM      Regarding: [facility] referral       Needs referral re: IBS and suspected pelvic floor dysfunction

## 2011-11-17 NOTE — Progress Notes (Signed)
Subjective:    Patient ID: John [name], male    DOB: 01/27/1941, 71 y.o.   MRN: [id number]  HPI The patient returns to discuss his ongoing problems with his bowel movements. He as chronic IBS problems and when I saw him last May I thought he probably had pelvic floor dysfunction on the basis of my rectal examination. He was referred to urology because he does have some voiding problems as well though those were the minimal component of his issues. He saw a physical therapist at urology and did do about 5 sessions of training instead it would help transiently but only for about a day after he did a session, to help suspected pelvic floor dysfunction. There was a co-pay with this which also was part of the issue i.e. cost.  He continues to have bowel movements one or more a day, but struggles to tell whether or not he is going to pass flatus or having a bowel movement. He is still reluctant to go out because when he has stools they are messy and voluminous and he does not like to use public restroom. His stools are generally loose. He continues with anal burning at times as well. He is not using any laxatives. He says he has to overeat, to develop the urge to defecate however. Allergies  Allergen Reactions  . Hydrocodone     REACTION: constipation  . Latex     REACTION: rash   Outpatient Prescriptions Prior to Visit  Medication Sig Dispense Refill  . aspirin 325 MG tablet Take 325 mg by mouth daily.        Marland Kitchen atenolol-chlorthalidone (TENORETIC) 50-25 MG per tablet Take 1 tablet by mouth daily.  30 tablet  5  . divalproex (DEPAKOTE SPRINKLE) 125 MG capsule Take 125 mg by mouth. 2 capsule in the morning and bedtime and one capsule with lunch and dinner       . risperiDONE (RISPERDAL) 1 MG tablet Take 1 mg by mouth daily. Take once daily       . sertraline (ZOLOFT) 100 MG tablet Take 100 mg by mouth daily. 1/2 tablet  With dinner       . Acetaminophen-Codeine (TYLENOL/CODEINE #3) 300-30 MG per  tablet Take 1-2 tablets by mouth every 6 (six) hours as needed.        . Loperamide-Simethicone (IMODIUM ADVANCED) 2-125 MG TABS Take by mouth as needed.        Marland Kitchen PARoxetine (PAXIL) 10 MG tablet Take 10 mg by mouth every morning.        . traMADol (ULTRAM) 50 MG tablet Take 50 mg by mouth every 6 (six) hours as needed. 1-2 by mouth every 6 hours as needed for pain at bedtime       . traZODone (DESYREL) 100 MG tablet Take 100 mg by mouth at bedtime.         Past Medical History  Diagnosis Date  . GERD (gastroesophageal reflux disease)   . HTN (hypertension)   . Diverticulitis     history of  . Diverticulosis of colon   . Reflex sympathetic dystrophy of the arm     left arm  . 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 IBS (irritable bowel syndrome)     see Dr. Leone Payor  . Bipolar disorder     sees Dr. Georgina Pillion in [place]  . Hemorrhoids, internal   . Hemorrhoids, external   . Gastritis   .  Arthritis   . Depression   . Tubular adenoma 05/08/2009  . Anxiety states   . Pelvic floor dysfunction   . Colon polyps    Past Surgical History  Procedure Date  . Appendectomy   . Left elbow surgery x 2     x 2  . Carpal tunnel release     left arm  . Spine surgery   . Tonsillectomy   . Colonoscopy 05/08/2009    adenoma, severe sigmid diverticulosis, int hemorrhoids, NL  random colon bxs and terminal ileum  . Esophagogastroduodenoscopy 10/04/2002    gastritis   History   Social History  . Marital Status: Legally Separated    Spouse Name: N/A    Number of Children: 2  . Years of Education: N/A   Occupational History  . Retired    Social History Main Topics  . Smoking status: Former Games developer  . Smokeless tobacco: Never Used  . Alcohol Use: No  . Drug Use: No    Social History Narrative   Daily caffeine use: 4 cups of coffee daily.Patient does not get regular exercise   Family History  Problem Relation Age of Onset  . Ovarian cancer Mother   .  Celiac disease Brother   . Alcohol abuse Father   . Colon cancer Neg Hx         Review of Systems New problem of right and left groin and back pain that occur with sitting for long periods of time. Do not seem to be related to now movements. We discussed I recommended he discussed that with his primary care physician as it could be some sort of spine issue.    Objective:   Physical Exam Alert, well-developed no acute distress       Assessment & Plan:   1. Irritable bowel syndrome (IBS)   2. Pelvic floor dysfunction    Continues to struggle with this. I am at the limits of what I can do to help him. We did not pursue formal anorectal manometry studies in the past as I thought he might benefit from pelvic floor retraining through physical therapy. I am not sure failed that but that time did not really workout and improve things for him.  I will make a referral to [facility] regarding suspected pelvic floor dysfunction and IBS problems to see if they can help him with his problems.

## 2011-11-17 NOTE — Patient Instructions (Signed)
You should hear from [place] by next week regarding a referral to [facility] in [place].

## 2011-12-18 ENCOUNTER — Other Ambulatory Visit: Payer: Self-pay | Admitting: Family Medicine

## 2012-01-15 ENCOUNTER — Telehealth: Payer: Self-pay | Admitting: Family Medicine

## 2012-01-15 MED ORDER — ATENOLOL-CHLORTHALIDONE 50-25 MG PO TABS: 1.0000 | ORAL_TABLET | Freq: Every day | ORAL | Status: DC

## 2012-01-15 NOTE — Telephone Encounter (Signed)
Pt requested a 90 day supply of Atenolol-Chlorthal and send to [facility]. I sent script e-scribe.

## 2012-04-08 ENCOUNTER — Ambulatory Visit: Payer: Medicare Other

## 2012-04-08 ENCOUNTER — Ambulatory Visit (INDEPENDENT_AMBULATORY_CARE_PROVIDER_SITE_OTHER): Payer: Medicare Other | Admitting: Family Medicine

## 2012-04-08 ENCOUNTER — Encounter: Payer: Self-pay | Admitting: Family Medicine

## 2012-04-08 VITALS — BP 114/64 | HR 66 | Temp 98.9°F | Wt 189.0 lb

## 2012-04-08 DIAGNOSIS — D473 Essential (hemorrhagic) thrombocythemia: Secondary | ICD-10-CM

## 2012-04-08 MED ORDER — HYDROXYUREA 500 MG PO CAPS: 500.0000 mg | ORAL_CAPSULE | Freq: Every day | ORAL | Status: AC

## 2012-04-08 NOTE — Progress Notes (Signed)
  Subjective:    Patient ID: John [name], male    DOB: Sep 25, 1940, 71 y.o.   MRN: [id number]  HPI Here asking to have labs drawn. I have not seen him for 2 years, and in the meantime he was referred to [facility] to evaluate his chronic GI issues. During this time apparently he was found to have an elevated platelet count, so he got involved with the [facility] group. He has been diagnosed with essential thrombocythemia, and he is seen by Dr. Gorden Harms. His last platelet count 2 weeks ago was 704,000. He was started on Hydroxyurea, and he was told to have a CBC drawn every 2 weeks. He is here today to have this done. In general he feels fine except for his chronic GI complaints. He still takes an adult aspirin daily.    Review of Systems  Constitutional: Negative.   Respiratory: Negative.   Cardiovascular: Negative.   Neurological: Negative.        Objective:   Physical Exam  Constitutional: He is oriented to person, place, and time. He appears well-developed and well-nourished.  Neck: No thyromegaly present.  Cardiovascular: Normal rate, regular rhythm, normal heart sounds and intact distal pulses.   Pulmonary/Chest: Effort normal and breath sounds normal.  Lymphadenopathy:    He has no cervical adenopathy.  Neurological: He is alert and oriented to person, place, and time.          Assessment & Plan:  We will draw a CBC today but I explained that we cannot draw these labs on an on going basis. He needs to take these orders to an outpatient lab facility like [facility] for this. He understands.

## 2012-04-09 LAB — CBC WITH DIFFERENTIAL/PLATELET
Basophils Absolute: 0.1 10*3/uL (ref 0.0–0.1)
Basophils Relative: 1 % (ref 0–1)
Eosinophils Absolute: 0.1 10*3/uL (ref 0.0–0.7)
Hemoglobin: 9.8 g/dL — ABNORMAL LOW (ref 13.0–17.0)
MCH: 30.2 pg (ref 26.0–34.0)
MCHC: 33.7 g/dL (ref 30.0–36.0)
Neutro Abs: 3.9 10*3/uL (ref 1.7–7.7)
Neutrophils Relative %: 54 % (ref 43–77)
Platelets: 710 10*3/uL — ABNORMAL HIGH (ref 150–400)
RDW: 20.6 % — ABNORMAL HIGH (ref 11.5–15.5)

## 2012-04-13 NOTE — Progress Notes (Signed)
Quick Note:  Labs faxed ______ 

## 2012-04-13 NOTE — Progress Notes (Signed)
Labs faxed

## 2012-05-21 ENCOUNTER — Encounter: Payer: Self-pay | Admitting: Family Medicine

## 2012-05-21 ENCOUNTER — Ambulatory Visit (INDEPENDENT_AMBULATORY_CARE_PROVIDER_SITE_OTHER): Payer: Medicare Other | Admitting: Family Medicine

## 2012-05-21 ENCOUNTER — Emergency Department (HOSPITAL_COMMUNITY): Payer: Medicare Other

## 2012-05-21 ENCOUNTER — Encounter (HOSPITAL_COMMUNITY): Payer: Self-pay | Admitting: Emergency Medicine

## 2012-05-21 ENCOUNTER — Observation Stay (HOSPITAL_COMMUNITY)
Admission: EM | Admit: 2012-05-21 | Discharge: 2012-05-23 | Disposition: A | Discharge status: Home / Self Care | Payer: Medicare Other | Attending: Internal Medicine | Admitting: Internal Medicine

## 2012-05-21 VITALS — BP 112/64 | HR 67 | Temp 98.8°F

## 2012-05-21 DIAGNOSIS — Z531 Procedure and treatment not carried out because of patient's decision for reasons of belief and group pressure: Secondary | ICD-10-CM

## 2012-05-21 DIAGNOSIS — R42 Dizziness and giddiness: Secondary | ICD-10-CM | POA: Insufficient documentation

## 2012-05-21 DIAGNOSIS — R5383 Other fatigue: Secondary | ICD-10-CM

## 2012-05-21 DIAGNOSIS — R296 Repeated falls: Secondary | ICD-10-CM

## 2012-05-21 DIAGNOSIS — R531 Weakness: Secondary | ICD-10-CM

## 2012-05-21 DIAGNOSIS — K219 Gastro-esophageal reflux disease without esophagitis: Secondary | ICD-10-CM | POA: Insufficient documentation

## 2012-05-21 DIAGNOSIS — M6289 Other specified disorders of muscle: Secondary | ICD-10-CM

## 2012-05-21 DIAGNOSIS — I10 Essential (primary) hypertension: Secondary | ICD-10-CM | POA: Insufficient documentation

## 2012-05-21 DIAGNOSIS — D696 Thrombocytopenia, unspecified: Secondary | ICD-10-CM | POA: Insufficient documentation

## 2012-05-21 DIAGNOSIS — R109 Unspecified abdominal pain: Secondary | ICD-10-CM | POA: Insufficient documentation

## 2012-05-21 DIAGNOSIS — I9589 Other hypotension: Secondary | ICD-10-CM | POA: Insufficient documentation

## 2012-05-21 DIAGNOSIS — D649 Anemia, unspecified: Principal | ICD-10-CM | POA: Insufficient documentation

## 2012-05-21 DIAGNOSIS — Z8719 Personal history of other diseases of the digestive system: Secondary | ICD-10-CM

## 2012-05-21 DIAGNOSIS — K589 Irritable bowel syndrome without diarrhea: Secondary | ICD-10-CM | POA: Insufficient documentation

## 2012-05-21 DIAGNOSIS — R2681 Unsteadiness on feet: Secondary | ICD-10-CM

## 2012-05-21 DIAGNOSIS — Z9181 History of falling: Secondary | ICD-10-CM | POA: Insufficient documentation

## 2012-05-21 DIAGNOSIS — R269 Unspecified abnormalities of gait and mobility: Secondary | ICD-10-CM

## 2012-05-21 DIAGNOSIS — I959 Hypotension, unspecified: Secondary | ICD-10-CM | POA: Diagnosis present

## 2012-05-21 DIAGNOSIS — R5381 Other malaise: Secondary | ICD-10-CM | POA: Insufficient documentation

## 2012-05-21 DIAGNOSIS — F411 Generalized anxiety disorder: Secondary | ICD-10-CM

## 2012-05-21 DIAGNOSIS — Z8673 Personal history of transient ischemic attack (TIA), and cerebral infarction without residual deficits: Secondary | ICD-10-CM | POA: Insufficient documentation

## 2012-05-21 DIAGNOSIS — F319 Bipolar disorder, unspecified: Secondary | ICD-10-CM | POA: Insufficient documentation

## 2012-05-21 DIAGNOSIS — Z79899 Other long term (current) drug therapy: Secondary | ICD-10-CM | POA: Insufficient documentation

## 2012-05-21 LAB — CBC WITH DIFFERENTIAL/PLATELET
Basophils Absolute: 0 10*3/uL (ref 0.0–0.1)
Basophils Relative: 0.8 % (ref 0.0–3.0)
HCT: 24.8 % — ABNORMAL LOW (ref 39.0–52.0)
Hemoglobin: 8 g/dL — CL (ref 13.0–17.0)
Lymphocytes Relative: 43.5 % (ref 12.0–46.0)
Lymphs Abs: 1.9 10*3/uL (ref 0.7–4.0)
MCHC: 32.2 g/dL (ref 30.0–36.0)
Monocytes Relative: 7.9 % (ref 3.0–12.0)
Neutro Abs: 2.1 10*3/uL (ref 1.4–7.7)
RBC: 2.38 Mil/uL — ABNORMAL LOW (ref 4.22–5.81)
RDW: 25.5 % — ABNORMAL HIGH (ref 11.5–14.6)

## 2012-05-21 LAB — BASIC METABOLIC PANEL
BUN: 35 mg/dL — ABNORMAL HIGH (ref 6–23)
CO2: 23 mEq/L (ref 19–32)
Calcium: 9 mg/dL (ref 8.4–10.5)
GFR calc non Af Amer: 57 mL/min — ABNORMAL LOW (ref >90)
GFR: 58.3 mL/min — ABNORMAL LOW (ref >60.00)
Glucose, Bld: 94 mg/dL (ref 70–99)
Potassium: 4.7 mEq/L (ref 3.5–5.1)
Potassium: 4 mEq/L (ref 3.5–5.1)
Sodium: 142 mEq/L (ref 135–145)
Sodium: 139 mEq/L (ref 135–145)

## 2012-05-21 LAB — CBC
HCT: 21.7 % — ABNORMAL LOW (ref 39.0–52.0)
Hemoglobin: 7.6 g/dL — ABNORMAL LOW (ref 13.0–17.0)
MCH: 33.5 pg (ref 26.0–34.0)
MCHC: 35 g/dL (ref 30.0–36.0)
RBC: 2.27 MIL/uL — ABNORMAL LOW (ref 4.22–5.81)

## 2012-05-21 LAB — HEPATIC FUNCTION PANEL
AST: 18 U/L (ref 0–37)
AST: 18 U/L (ref 0–37)
Albumin: 4 g/dL (ref 3.5–5.2)
Alkaline Phosphatase: 33 U/L — ABNORMAL LOW (ref 39–117)
Bilirubin, Direct: <0.1 mg/dL (ref 0.0–0.3)
Total Bilirubin: 0.8 mg/dL (ref 0.3–1.2)
Total Bilirubin: 0.3 mg/dL (ref 0.3–1.2)

## 2012-05-21 LAB — MAGNESIUM
Magnesium: 1.8 mg/dL (ref 1.5–2.5)
Magnesium: 1.8 mg/dL (ref 1.5–2.5)

## 2012-05-21 LAB — TSH: TSH: 2.42 u[IU]/mL (ref 0.35–5.50)

## 2012-05-21 NOTE — ED Notes (Signed)
Pt alert, arrives from home, sent per PCP, per family severe anemia. resp even unlabored, skin pale warm and dry,. Denies chest pain, SOB with exertion, denies noting blood in stool/urine

## 2012-05-21 NOTE — ED Notes (Signed)
Pt refused type and screen and all blood products

## 2012-05-21 NOTE — ED Notes (Signed)
Patient transported to CT 

## 2012-05-21 NOTE — Progress Notes (Signed)
  Subjective:    Patient ID: John [name], male    DOB: [birth year], 71 y.o.   MRN: [id number]  HPI Here with his wife for a month  of increased generalized weakness and lightheadedness. It is difficult for him to walk and his legs seem to buckle underneath him. He has fallen at home about 12 times in the past 2 weeks, no significant injuries. No HAs or nausea. No chest pain or palpitations or SOB. No vision changes. He saw Dr. Kerrin Champagne  2 days ago for his thrombocythemia, and this appears to be stable.    Review of Systems  Respiratory: Negative.   Cardiovascular: Negative.   Neurological: Positive for weakness and light-headedness. Negative for dizziness, tremors, seizures, syncope, facial asymmetry, speech difficulty, numbness and headaches.       Objective:   Physical Exam  Constitutional: He is oriented to person, place, and time.       Weak but alert, in a wheelchair   Eyes: Conjunctivae normal and EOM are normal. Pupils are equal, round, and reactive to light.  Neck: No thyromegaly present.  Cardiovascular: Normal rate, regular rhythm, normal heart sounds and intact distal pulses.  Exam reveals no gallop and no friction rub.   No murmur heard. Pulmonary/Chest: Effort normal and breath sounds normal. No respiratory distress. He has no wheezes. He has no rales.  Lymphadenopathy:    He has no cervical adenopathy.  Neurological: He is oriented to person, place, and time. He has normal reflexes. No cranial nerve deficit.       Unsteady on his feet, generally weak            Assessment & Plan:  Weakness and unsteadiness of uncertain etiology. We will get labs today. Set up a brain MRI soon. Refer to Neurology

## 2012-05-21 NOTE — ED Notes (Signed)
Pt states that he fell at home yesterday no LOC and hit his tailbone on rocker chair.

## 2012-05-21 NOTE — ED Provider Notes (Signed)
History     CSN: [id number]  Arrival date & time 05/21/12  1842   First MD Initiated Contact with Patient 05/21/12 2205     Wife is [employer] follows her wishes and does not accept blood products.  Chief Complaint  Patient presents with  . Abnormal Lab    (Consider location/radiation/quality/duration/timing/severity/associated sxs/prior treatment) HPI John [name] is a 71 y.o. male history of bipolar disorder, diverticulitis/diverticulosis and a essential thrombocythemia presents to the ER for frequent falls this week, weakness and dizziness as well as abnormal lab test obtained from PCP today. Patient's had multiple falls this week, and his Risperdal dose has been adjusted this week by a psychiatrist thought to perhaps be contributing to frequent falls. He has not noticed any blood in the stools or black stools however his hemoglobin was 8.0 and earlier H&H drawn by PCM. Patient says his fall about 12 times in the last 2 weeks-family is concerned as he does have history of prior subdural hematoma.  He describes his symptoms as generalized weakness, his legs buckle if he has nothing to catch himself he falls. He denies hitting his head and denies any loss of consciousness. He also has a history of 2 CVAs however denies any CVA or TIA symptoms. No current new neurologic deficits. 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   Patient has seen Dr. Kerrin Champagne his hematologist at [facility] this week with no new changes to his hydroxyurea therapy regimen for essential thrombocythemia.  Daughter and wife add while not in the presence of the patient that he has a history of mood disorder related to his bipolar disease, and does not like to talk about his bipolar disease in front of physicians, and does have severe behavioral issues while off medications.  Patient denies any chest pain, shortness of breath, loss of  consciousness, fevers, chills, abdominal pain, nausea vomiting or diarrhea.   Past Medical History  Diagnosis Date  . GERD (gastroesophageal reflux disease)   . HTN (hypertension)   . Diverticulitis     history of  . Diverticulosis of colon   . Reflex sympathetic dystrophy of the arm     left arm  . 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 IBS (irritable bowel syndrome)     see Dr. Leone Payor  . Bipolar disorder     sees Dr. Georgina Pillion in [place]  . Hemorrhoids, internal   . Hemorrhoids, external   . Gastritis   . Arthritis   . Depression   . Tubular adenoma 05/08/2009  . Anxiety states   . Pelvic floor dysfunction   . Colon polyps   . Thrombocythemia, essential     sees Dr. Kerrin Champagne at [facility]     Past Surgical History  Procedure Date  . Appendectomy   . Left elbow surgery x 2     x 2  . Carpal tunnel release     left arm  . Spine surgery   . Tonsillectomy   . Colonoscopy 05/08/2009    adenoma, severe sigmid diverticulosis, int hemorrhoids, NL  random colon bxs and terminal ileum  . Esophagogastroduodenoscopy 10/04/2002    gastritis    Family History  Problem Relation Age of Onset  . Ovarian cancer Mother   . Celiac disease Brother   . Alcohol abuse Father   . Colon cancer Neg Hx  History  Substance Use Topics  . Smoking status: Former Games developer  . Smokeless tobacco: Never Used  . Alcohol Use: No      Review of Systems At least 10pt or greater review of systems completed and are negative except where specified in the HPI.  Allergies  Hydrocodone; Latex; and Risperidone and related  Home Medications   Current Outpatient Rx  Name Route Sig Dispense Refill  . ASPIRIN 325 MG PO TABS Oral Take 325 mg by mouth daily.      . ATENOLOL-CHLORTHALIDONE 50-25 MG PO TABS Oral Take 1 tablet by mouth daily. 90 tablet 3  . DIVALPROEX SODIUM 125 MG PO CPSP Oral Take 125-250 mg by mouth. 2 capsules (250mg ) in the morning and bedtime. 1  capsule (125mg ) with lunch and dinner.    Marland Kitchen HYDROXYUREA 500 MG PO CAPS Oral Take 500 mg by mouth daily. Take alternating doses of 1000 mg and 1500 mg. Took 1500mg  05/21/2012.    Marland Kitchen RISPERIDONE 1 MG PO TABS Oral Take 0.5 mg by mouth daily. Take once daily    . SERTRALINE HCL 100 MG PO TABS Oral Take 150 mg by mouth every evening.       BP 104/68  Pulse 60  Temp 98 F (36.7 C)  Resp 16  SpO2 100%  Physical Exam  Nursing notes reviewed.  Electronic medical record reviewed. VITAL SIGNS:   Filed Vitals:   05/21/12 1932 05/21/12 1948  BP: 104/68   Pulse: 66 60  Temp: 98 F (36.7 C)   Resp: 16 16  SpO2: 99% 100%   CONSTITUTIONAL: Awake, oriented, appears non-toxic HENT: Atraumatic, normocephalic, oral mucosa pink and moist, airway patent. Nares patent without drainage. External ears normal. EYES: Conjunctiva clear, palpebral conjunctiva pale, EOMI, PERRLA NECK: Trachea midline, non-tender, supple CARDIOVASCULAR: Normal heart rate, Normal rhythm, No murmurs, rubs, gallops PULMONARY/CHEST: Clear to auscultation, no rhonchi, wheezes, or rales. Symmetrical breath sounds. Non-tender. ABDOMINAL: Non-distended, soft, non-tender - no rebound or guarding.  BS normal. NEUROLOGIC: Non-focal, moving all four extremities, left hand is kept clenched - he cannot open it, sensation is mildly different on the left hand and arm. No other gross sensory or motor deficits. Cranial nerves are normal as tested. EXTREMITIES: No clubbing, cyanosis, or edema SKIN: Warm, Dry, No erythema, No rash Psych: Flat affect, euthymic. ED Course  Procedures (including critical care time)  Labs Reviewed  CBC - Abnormal; Notable for the following:    RBC 2.27 (*)     Hemoglobin 7.6 (*)     HCT 21.7 (*)     RDW 21.8 (*)     Platelets 121 (*)     All other components within normal limits  BASIC METABOLIC PANEL - Abnormal; Notable for the following:    BUN 35 (*)     GFR calc non Af Amer 57 (*)     GFR calc Af Amer  66 (*)     All other components within normal limits  HEPATIC FUNCTION PANEL - Abnormal; Notable for the following:    Alkaline Phosphatase 36 (*)     All other components within normal limits  MAGNESIUM  OCCULT BLOOD, POC DEVICE  VALPROIC ACID LEVEL  TYPE AND SCREEN   Ct Head Wo Contrast  05/21/2012  *RADIOLOGY REPORT*  Clinical Data: Frequent falls  CT HEAD WITHOUT CONTRAST  Technique:  Contiguous axial images were obtained from the base of the skull through the vertex without contrast.  Comparison: 12/20/2008  Findings: Encephalomalacic changes related  to prior right frontal infarct, old.  Additional encephalomalacia changes related to prior right posterior frontal/insular infarct (series 2/image 8), old, although new from 2010.  No evidence of parenchymal hemorrhage or extra-axial fluid collection. No mass lesion, mass effect, or midline shift.  No CT evidence of acute infarction.  Mild cortical atrophy, likely age appropriate.  No ventriculomegaly.  The visualized paranasal sinuses are essentially clear. The mastoid air cells are unopacified.  No evidence of calvarial fracture.  Mild bifrontal craniotomies.  IMPRESSION: No evidence of acute intracranial abnormality.  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   Original Report Authenticated By: Charline Bills, M.D.    Dg Abd 2 Views  05/21/2012  *RADIOLOGY REPORT*  Clinical Data: Abdominal pain, history of diverticulitis, IBS  ABDOMEN - 2 VIEW  Comparison: 08/07/2010  Findings: Nonobstructive bowel gas pattern.  No evidence of free air on the lateral decubitus view.  Degenerative changes of the visualized thoracolumbar spine.  IMPRESSION: No evidence of small bowel obstruction or free air.   Original Report Authenticated By: Charline Bills, M.D.      1. Anemia   2. History of diverticulosis   3. Frequent falls   4. Bipolar affective disorder       MDM  John [name] is a 71 y.o. male  history of bipolar disease, diverticulosis and anemia  does not want to take blood products because of his wife's being a Jehovah's Witness presents with dizziness, lightheadedness frequent falls and imbalance over the last few weeks.  Patient's hemoglobin earlier at the primary care physician's office was 8.0.  On my rectal exam, there is no gross blood - & on laboratory analysis there is no occult blood.  Patient's hemoglobin is somewhat lower at 7.6 that was earlier this may be due to test variance, however he may also be losing blood slowly not detectable on a single occult blood screen. Platelets are currently 121. A concern at this point is that the patient will continue to lose blood slowly - and  may need a colonoscopy to see if there is a source of bleeding.   that can be stopped since the patient does not want to accept blood products.   Renal function is adequate and he has got no other source of bleeding, no hematuria per report, no hematemesis or hemoptysis - sodium and the fact that he's got a history of diverticulosis a think this is likely where his blood loss is. She did have a colonoscopy a couple weeks ago which showed diverticulosis and was otherwise normal. But based on the fact that he does not accept blood products, there will not be else we can do besides observe and possible imaging if his hemoglobin drops too low on serial exam.  Discussed admission with wife and patient, discussed with Dr.Devine [facility] for admission         Jones Skene, MD 05/22/12 [phone]

## 2012-05-21 NOTE — Progress Notes (Signed)
Quick Note:  I spoke with pt's wife and she is going to take him to the ER. ______

## 2012-05-21 NOTE — ED Notes (Signed)
Pt states he will not receive any blood products,  He advised this Clinical research associate and Neena Rhymes RN

## 2012-05-22 DIAGNOSIS — R531 Weakness: Secondary | ICD-10-CM | POA: Diagnosis present

## 2012-05-22 DIAGNOSIS — F319 Bipolar disorder, unspecified: Secondary | ICD-10-CM

## 2012-05-22 DIAGNOSIS — F411 Generalized anxiety disorder: Secondary | ICD-10-CM

## 2012-05-22 DIAGNOSIS — IMO0001 Reserved for inherently not codable concepts without codable children: Secondary | ICD-10-CM

## 2012-05-22 DIAGNOSIS — R296 Repeated falls: Secondary | ICD-10-CM

## 2012-05-22 DIAGNOSIS — K589 Irritable bowel syndrome without diarrhea: Secondary | ICD-10-CM

## 2012-05-22 DIAGNOSIS — R5381 Other malaise: Secondary | ICD-10-CM

## 2012-05-22 DIAGNOSIS — Z531 Procedure and treatment not carried out because of patient's decision for reasons of belief and group pressure: Secondary | ICD-10-CM

## 2012-05-22 DIAGNOSIS — Z9181 History of falling: Secondary | ICD-10-CM

## 2012-05-22 DIAGNOSIS — D649 Anemia, unspecified: Principal | ICD-10-CM

## 2012-05-22 DIAGNOSIS — I959 Hypotension, unspecified: Secondary | ICD-10-CM | POA: Diagnosis present

## 2012-05-22 LAB — VITAMIN B12: Vitamin B-12: 535 pg/mL (ref 211–911)

## 2012-05-22 LAB — IRON AND TIBC
Saturation Ratios: 69 % — ABNORMAL HIGH (ref 20–55)
TIBC: 255 ug/dL (ref 215–435)
UIBC: 79 ug/dL — ABNORMAL LOW (ref 125–400)

## 2012-05-22 LAB — RETICULOCYTES
RBC.: 2.23 MIL/uL — ABNORMAL LOW (ref 4.22–5.81)
Retic Ct Pct: 0.4 % (ref 0.4–3.1)

## 2012-05-22 MED ORDER — RISPERIDONE 0.5 MG PO TABS
0.5000 mg | ORAL_TABLET | Freq: Every day | ORAL | Status: DC
  Administered 2012-05-22 – 2012-05-23 (×2): 0.5 mg via ORAL
  Filled 2012-05-22 (×2): qty 1

## 2012-05-22 MED ORDER — DIVALPROEX SODIUM 125 MG PO CPSP
250.0000 mg | ORAL_CAPSULE | ORAL | Status: DC
  Administered 2012-05-22 – 2012-05-23 (×3): 250 mg via ORAL
  Filled 2012-05-22 (×5): qty 2

## 2012-05-22 MED ORDER — DIVALPROEX SODIUM 125 MG PO CPSP
125.0000 mg | ORAL_CAPSULE | Freq: Two times a day (BID) | ORAL | Status: DC
  Administered 2012-05-22 – 2012-05-23 (×3): 125 mg via ORAL
  Filled 2012-05-22 (×4): qty 1

## 2012-05-22 MED ORDER — SODIUM CHLORIDE 0.9 % IV SOLN
INTRAVENOUS | Status: DC
  Administered 2012-05-22 – 2012-05-23 (×2): via INTRAVENOUS

## 2012-05-22 MED ORDER — HYDROXYUREA 500 MG PO CAPS: 500.0000 mg | ORAL_CAPSULE | Freq: Every day | ORAL | Status: DC

## 2012-05-22 MED ORDER — SODIUM CHLORIDE 0.9 % IJ SOLN
10.0000 mL | INTRAMUSCULAR | Status: DC | PRN
  Administered 2012-05-23: 10 mL

## 2012-05-22 MED ORDER — SERTRALINE HCL 50 MG PO TABS
150.0000 mg | ORAL_TABLET | Freq: Every evening | ORAL | Status: DC
  Administered 2012-05-22: 150 mg via ORAL
  Filled 2012-05-22 (×2): qty 1

## 2012-05-22 MED ORDER — DIVALPROEX SODIUM 125 MG PO CPSP: 125.0000 mg | ORAL_CAPSULE | Freq: Two times a day (BID) | ORAL | Status: DC

## 2012-05-22 MED ORDER — ONDANSETRON HCL 4 MG/2ML IJ SOLN: 4.0000 mg | Freq: Three times a day (TID) | INTRAMUSCULAR | Status: AC | PRN

## 2012-05-22 MED ORDER — SODIUM CHLORIDE 0.9 % IJ SOLN: 3.0000 mL | Freq: Two times a day (BID) | INTRAMUSCULAR | Status: DC

## 2012-05-22 MED ORDER — SODIUM CHLORIDE 0.9 % IV SOLN
INTRAVENOUS | Status: AC
  Administered 2012-05-22: 03:00:00 via INTRAVENOUS

## 2012-05-22 NOTE — Progress Notes (Signed)
Patient confirms refusal of blood products with this RN. States he is not Jehovah Witness but that he "doesn't subscribe" to blood products. Per patient he has been educated on the subject of blood transfusions. Ginny Forth

## 2012-05-22 NOTE — H&P (Signed)
PCP:   [name], MD   Chief Complaint:  Weakness and freq falls  HPI: 71 yo male h/o ibs/thrombocytopenia comes in with generalized weakness that has worsened and causeing him to fall freq due to weakness and dizziness.  No melena/n/v/brbpr.  Chronic diarrhea and has been referred to [facility] by [facility] recenttly for w/u for possible pelvic floor dysfunction.  Over the last year his hgb has dropped from 11 to now 7.8.    Review of Systems:  Ow neg  Past Medical History: Past Medical History  Diagnosis Date  . GERD (gastroesophageal reflux disease)   . HTN (hypertension)   . Diverticulitis     history of  . Diverticulosis of colon   . Reflex sympathetic dystrophy of the arm     left arm  . 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 IBS (irritable bowel syndrome)     see Dr. Leone Payor  . Bipolar disorder     sees Dr. Georgina Pillion in [place]  . Hemorrhoids, internal   . Hemorrhoids, external   . Gastritis   . Arthritis   . Depression   . Tubular adenoma 05/08/2009  . Anxiety states   . Pelvic floor dysfunction   . Colon polyps   . Thrombocythemia, essential     sees [facility]. Kerrin Champagne at [facility]    Past Surgical History  Procedure Date  . Appendectomy   . Left elbow surgery x 2     x 2  . Carpal tunnel release     left arm  . Spine surgery   . Tonsillectomy   . Colonoscopy 05/08/2009    adenoma, severe sigmid diverticulosis, int hemorrhoids, NL  random colon bxs and terminal ileum  . Esophagogastroduodenoscopy 10/04/2002    gastritis    Medications: Prior to Admission medications   Medication Sig Start Date End Date Taking? Authorizing Provider  aspirin 325 MG tablet Take 325 mg by mouth daily.     Yes Historical Provider, MD  atenolol-chlorthalidone (TENORETIC) 50-25 MG per tablet Take 1 tablet by mouth daily. 01/15/12  Yes [name], MD  divalproex (DEPAKOTE SPRINKLE) 125 MG capsule Take 125-250 mg by mouth. 2 capsules (250mg ) in  the morning and bedtime. 1 capsule (125mg ) with lunch and dinner.   Yes Historical Provider, MD  hydroxyurea (HYDREA) 500 MG capsule Take 500 mg by mouth daily. Take alternating doses of 1000 mg and 1500 mg. Took 1500mg  05/21/2012.   Yes Historical Provider, MD  risperiDONE (RISPERDAL) 1 MG tablet Take 0.5 mg by mouth daily. Take once daily   Yes Historical Provider, MD  sertraline (ZOLOFT) 100 MG tablet Take 150 mg by mouth every evening.    Yes Historical Provider, MD    Allergies:   Allergies  Allergen Reactions  . Hydrocodone     REACTION: constipation  . Latex     REACTION: rash  . Risperidone And Related     Sensitivity can take low doses    Social History:  reports that he has quit smoking. He has never used smokeless tobacco. He reports that he does not drink alcohol or use illicit drugs.  Family History: Family History  Problem Relation Age of Onset  . Ovarian cancer Mother   . Celiac disease Brother   . Alcohol abuse Father   . Colon cancer Neg Hx     Physical Exam: Filed Vitals:   05/21/12 1932 05/21/12 1948 05/22/12 0012  BP: 104/68  102/53  Pulse: 66 60 60  Temp: 98 F (36.7 C)    Resp: 16 16 16   SpO2: 99% 100% 99%   General appearance: alert, cooperative, no distress and pale Lungs: clear to auscultation bilaterally Heart: regular rate and rhythm, S1, S2 normal, no murmur, click, rub or gallop Abdomen: soft, non-tender; bowel sounds normal; no masses,  no organomegaly Extremities: extremities normal, atraumatic, no cyanosis or edema Pulses: 2+ and symmetric Skin: Skin color, texture, turgor normal. No rashes or lesions Neurologic: Grossly normal  Labs on Admission:   [facility] 05/21/12 2020 05/21/12 1502  NA 139 142  K 4.0 4.7  CL 103 107  CO2 23 25  GLUCOSE 94 112*  BUN 35* 35*  CREATININE 1.23 1.3  CALCIUM 9.2 9.0  MG 1.8 1.8  PHOS -- --    Basename 05/21/12 2020 05/21/12 1502  AST 18 18  ALT 9 12  ALKPHOS 36* 33*  BILITOT 0.3 0.8    PROT 7.3 7.8  ALBUMIN 3.9 4.0    Basename 05/21/12 2020 05/21/12 1502  WBC 4.5 4.4*  NEUTROABS -- 2.1  HGB 7.6* 8.0*  HCT 21.7* 24.8*  MCV 95.6 104.2*  PLT 121* 130.0*    Basename 05/21/12 1502  TSH 2.42  T4TOTAL --  T3FREE --  THYROIDAB --    Basename 05/21/12 1502  VITAMINB12 559  FOLATE --  FERRITIN --  TIBC --  IRON --  RETICCTPCT --    Radiological Exams on Admission: Ct Head Wo Contrast  05/21/2012  *RADIOLOGY REPORT*  Clinical Data: Frequent falls  CT HEAD WITHOUT CONTRAST  Technique:  Contiguous axial images were obtained from the base of the skull through the vertex without contrast.  Comparison: 12/20/2008  Findings: Encephalomalacic changes related to prior right frontal infarct, old.  Additional encephalomalacia changes related to prior right posterior frontal/insular infarct (series 2/image 80), old, although new from 2010.  No evidence of parenchymal hemorrhage or extra-axial fluid collection. No mass lesion, mass effect, or midline shift.  No CT evidence of acute infarction.  Mild cortical atrophy, likely age appropriate.  No ventriculomegaly.  The visualized paranasal sinuses are essentially clear. The mastoid air cells are unopacified.  No evidence of calvarial fracture.  Mild bifrontal craniotomies.  IMPRESSION: No evidence of acute intracranial abnormality.  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   Original Report Authenticated By: Charline Bills, M.D.    Dg Abd 2 Views  05/21/2012  *RADIOLOGY REPORT*  Clinical Data: Abdominal pain, history of diverticulitis, IBS  ABDOMEN - 2 VIEW  Comparison: 08/07/2010  Findings: Nonobstructive bowel gas pattern.  No evidence of free air on the lateral decubitus view.  Degenerative changes of the visualized thoracolumbar spine.  IMPRESSION: No evidence of small bowel obstruction or free air.   Original Report Authenticated By: Charline Bills, M.D.     Assessment/Plan Present on Admission: 71 yo male with symptomatic anemia,  jehovahs witness .Anemia .BIPOLAR AFFECTIVE DISORDER .Generalized weakness  Will ck anemia panel.  Ck orthostatics.  Hold bp meds.  Give ivf overnight.  Obs.  Pt refusing blood transfusion.  No overt bleeding.  May need to get [facility] to see tomorrow, however pt is seeing [facility] for his outpt w/u.  Pt and wife understand that without a blood transfusion, he probably will not become asymptomatic of his anemia by discharge.     DAVID,RACHAL A [phone] 05/22/2012, 1:10 AM

## 2012-05-22 NOTE — Progress Notes (Signed)
   CARE MANAGEMENT NOTE 05/22/2012  Patient:  John [name], John [name]   Account Number:  [id number]  Date Initiated:  05/22/2012  Documentation initiated by:  Bomani Oommen  Subjective/Objective Assessment:   Referral for [facility] to provide continuous IV fluids via PICC.     Action/Plan:   Spoke with [facility] re continuous IV fluids in the home. Additional insurance information required and sent. GoodPossibility of this being approved by insurance , await specific orders for fluids and rate.   Anticipated DC Date:  05/25/2012   Anticipated DC Plan:  HOME W HOME HEALTH SERVICES         Choice offered to / List presented to:     DME arranged  OTHER - SEE COMMENT  IV PUMP/EQUIPMENT      DME agency  [facility]     [facility] arranged  HH-1 RN      [facility] agency  [facility]   Status of service:  In process, will continue to follow Medicare Important Message given?   (If response is "NO", the following Medicare IM given date fields will be blank) Date Medicare IM given:   Date Additional Medicare IM given:    Discharge Disposition:    Per UR Regulation:    If discussed at Long Length of Stay Meetings, dates discussed:    Comments:  05/22/2012 Order for continuous IV fluids via PICC in the home, spoke with [facility], they required insurance group number , this is [id number] [id number], this was provided to [facility] . At this time the [facility] pharmacist thinks that this may be covered. Await specific orders for fluid type and rate. Johny Shock RN MPH Case Manager

## 2012-05-22 NOTE — Progress Notes (Signed)
Peripherally Inserted Central Catheter/Midline Placement  The IV Nurse has discussed with the patient and/or persons authorized to consent for the patient, the purpose of this procedure and the potential benefits and risks involved with this procedure.  The benefits include less needle sticks, lab draws from the catheter and patient may be discharged home with the catheter.  Risks include, but not limited to, infection, bleeding, blood clot (thrombus formation), and puncture of an artery; nerve damage and irregular heat beat.  Alternatives to this procedure were also discussed.  PICC/Midline Placement Documentation  PICC / Midline Single Lumen 05/22/12 PICC Right Basilic (Active)       Alaze Garverick, Lajean Manes 05/22/2012, 5:53 PM

## 2012-05-22 NOTE — Progress Notes (Addendum)
[facility] PROGRESS NOTE  ORRIE SCHUBERT ZOX:[id number] DOB: 1941-03-28 DOA: 05/21/2012 PCP: [name], MD  Assessment/Plan: 1. Anemia: No evidence of acute GI bleed. I suspect that his cause is likely production, and peripheral smear notes cells of different size and I suspect likely his MCV is normal because he has both micro-and macrocytosis. Have spoken with hematology for further recommendations. In the meantime he had severely low pressures and so going to place a PICC line and send the patient home with IV fluids upon discharge. This will help his blood pressure although this will do little for his blood level itself. In addition, if his anemia gets worse, he runs the risk of high output heart failure and fluids may worsen his condition. Discussed this with the patient and he is amenable to this plan. In addition, I reviewed all the patient's medications and every single one, with the exception of Zoloft once a risk for myelosuppression and different forms of anemia. We'll await further recommendations by hematology as to which ones may be the biggest suspect. I suspect that this may be indeed be the cause.  Addendum: Spoke with [facility] & they feel most likely suspect is hydrea (on hold since admit) Recommended also checking retic ct (pending) and erythropoetin level-ordered.  2. Symptomatic hypotension: Secondary to above. 3. Weakness: Secondary to #1 4. Bipolar disorder currently stable.  Code Status: Pt clarifies he wished to be a no code.  Family Communication: A spoken extensively to the patient and his wife (over the phone ) about the risks involved without getting blood. I respect his decisions, but admitted and clear in the understanding that likely the cause of his anemia we'll not be able to be quickly fix and will could require months if not longer time period there are risks of death in the meantime do to hypoperfusion due to his brain or heart or organ failure. Will lead  try to improve his blood pressure with continued IV fluids even at home. The patient is not cleared to drive from here on out until all this improves. Given risks for hypotension and syncope. The patient understands as does his wife.  Disposition Plan: Likely home in the next one to 2 days with PICC line and IV fluids to help with hypotension   Consultants:  Have consulted hematology for recommendations  Procedures:  None  Antibiotics:  None  HPI/Subjective: 71 year old white male past with history of IBS who presents with weakness and found to be anemic with a hemoglobin around 8.0 in his PCPs office. No evidence of acute blood loss looks to be more chronic. Course is complicated by the fact the patient is a Jehovah's Witness and refuses blood transfusions although he is symptomatically hypotensive with his pressure dropping down into the 70s at times.  Patient himself complains of dizziness when standing and fatigued when doing small things such as walking to the bathroom. He denies any chest pain.  Objective: Filed Vitals:   05/22/12 0012 05/22/12 0251 05/22/12 0358 05/22/12 0400  BP: 102/53  114/63 75/51  Pulse: 60  57 60  Temp:   98.4 F (36.9 C)   TempSrc:   Oral   Resp: 16  20   Height:  5\' 10"  (1.778 m)    Weight:  78.5 kg (173 lb 1 oz)    SpO2: 99%  99%     Intake/Output Summary (Last 24 hours) at 05/22/12 1151 Last data filed at 05/22/12 0651  Gross per 24 hour  Intake    300 ml  Output    225 ml  Net     75 ml   Filed Weights   05/22/12 0251  Weight: 78.5 kg (173 lb 1 oz)    Exam:   General:  Alert and oriented x3 in, no acute distress, fatigued, looks about stated age  HEENT: Normocephalic, atraumatic, mucous her meds are slightly dry  Cardiovascular: Regular rate and rhythm, S1-S2  Respiratory: Clear to auscultation bilaterally  Abdomen: Soft, nontender, nondistended, positive bowel sounds  Data Reviewed: Basic Metabolic Panel:  Lab  05/21/12 2020 05/21/12 1502  NA 139 142  K 4.0 4.7  CL 103 107  CO2 23 25  GLUCOSE 94 112*  BUN 35* 35*  CREATININE 1.23 1.3  CALCIUM 9.2 9.0  MG 1.8 1.8  PHOS -- --   Liver Function Tests:  Lab 05/21/12 2020 05/21/12 1502  AST 18 18  ALT 9 12  ALKPHOS 36* 33*  BILITOT 0.3 0.8  PROT 7.3 7.8  ALBUMIN 3.9 4.0   CBC:  Lab 05/21/12 2020 05/21/12 1502  WBC 4.5 4.4*  NEUTROABS -- 2.1  HGB 7.6* 8.0*  HCT 21.7* 24.8*  MCV 95.6 104.2*  PLT 121* 130.0*     Studies: Ct Head Wo Contrast  05/21/2012    IMPRESSION: No evidence of acute intracranial abnormality.  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   Original Report Authenticated By: Charline Bills, M.D.    Dg Abd 2 Views  05/21/2012    IMPRESSION: No evidence of small bowel obstruction or free air.   Original Report Authenticated By: Charline Bills, M.D.     Scheduled Meds:   . divalproex  125 mg Oral BID AC  . divalproex  250 mg Oral BH-qamhs  . risperiDONE  0.5 mg Oral Daily  . sertraline  150 mg Oral QPM  . sodium chloride  3 mL Intravenous Q12H  . DISCONTD: divalproex  125-250 mg Oral Q12H  . DISCONTD: hydroxyurea  500 mg Oral Daily   Continuous Infusions:   . sodium chloride 75 mL/hr at 05/22/12 0300    Principal Problem:  *Anemia Active Problems:  BIPOLAR AFFECTIVE DISORDER  Refusal of blood transfusions as patient is Jehovah's Witness  Generalized weakness  Frequent falls  Hypotension    Time spent: 30 minutes    Hollice Espy  [facility] Pager [phone]. If 8PM-8AM, please contact night-coverage at [email or web address], password [email or web address] 05/22/2012, 11:51 AM  LOS: 1 day

## 2012-05-22 NOTE — Progress Notes (Signed)
Orthostatic VS's were: lying 47829, sitting 75/51. Did not stand patient at this time due to significant drop from lying to sitting. Patient only complaint was of weakness. Denies dizzines. Ginny Forth

## 2012-05-22 NOTE — ED Notes (Signed)
Report called to floor RN

## 2012-05-22 NOTE — Progress Notes (Signed)
Pharmacy:  Hydroxyurea  Per P&T policy if any of the medication-specific conditions in the checklist below are identified, the medication will be held until physician review occurs as described in the oral  Chemotherapy policy.  Hydroxyurea (Hydrea) . ANC < 2000 . Pltc < 80K in sickle-cell patients; < 100K in other patients . Hgb ? 6 in sickle-cell patients; < 8 in other patients . Reticulocytes < 80K when Hgb < 9  On 05/22/12:  Reticulocyte count = 8.9 K/ul and Hbg = 7.6  Order to resume home regimen of Hydroxyurea has been placed on hold until further physician review.  Thank you, Terrilee Files, PharmD 05/22/12 @ 07:04

## 2012-05-22 NOTE — ED Notes (Signed)
Report attempted times one

## 2012-05-23 DIAGNOSIS — I10 Essential (primary) hypertension: Secondary | ICD-10-CM

## 2012-05-23 DIAGNOSIS — I959 Hypotension, unspecified: Secondary | ICD-10-CM

## 2012-05-23 LAB — BASIC METABOLIC PANEL WITH GFR
BUN: 30 mg/dL — ABNORMAL HIGH (ref 6–23)
CO2: 24 meq/L (ref 19–32)
Calcium: 8.8 mg/dL (ref 8.4–10.5)
Chloride: 104 meq/L (ref 96–112)
Creatinine, Ser: 1.1 mg/dL (ref 0.50–1.35)
GFR calc Af Amer: 76 mL/min — ABNORMAL LOW
GFR calc non Af Amer: 66 mL/min — ABNORMAL LOW
Glucose, Bld: 88 mg/dL (ref 70–99)
Potassium: 3.9 meq/L (ref 3.5–5.1)
Sodium: 137 meq/L (ref 135–145)

## 2012-05-23 LAB — CBC
HCT: 19.2 % — ABNORMAL LOW (ref 39.0–52.0)
MCH: 33.3 pg (ref 26.0–34.0)
MCV: 95 fL (ref 78.0–100.0)
RBC: 2.01 MIL/uL — ABNORMAL LOW (ref 4.22–5.81)
WBC: 3.7 10*3/uL — ABNORMAL LOW (ref 4.0–10.5)

## 2012-05-23 MED ORDER — HEPARIN SOD (PORK) LOCK FLUSH 100 UNIT/ML IV SOLN
250.0000 [IU] | Freq: Every day | INTRAVENOUS | Status: DC
  Administered 2012-05-23: 250 [IU]

## 2012-05-23 MED ORDER — FLUDROCORTISONE ACETATE 0.1 MG PO TABS
0.1000 mg | ORAL_TABLET | Freq: Every day | ORAL | Status: DC
  Administered 2012-05-23: 0.1 mg via ORAL
  Filled 2012-05-23: qty 1

## 2012-05-23 MED ORDER — SODIUM CHLORIDE 0.9 % IV SOLN: 75.0000 mL | INTRAVENOUS | Status: DC

## 2012-05-23 MED ORDER — FLUDROCORTISONE ACETATE 0.1 MG PO TABS: 0.1000 mg | ORAL_TABLET | Freq: Every day | ORAL | Status: DC

## 2012-05-23 MED ORDER — HEPARIN SOD (PORK) LOCK FLUSH 100 UNIT/ML IV SOLN
250.0000 [IU] | INTRAVENOUS | Status: DC | PRN
  Filled 2012-05-23: qty 3

## 2012-05-23 NOTE — Progress Notes (Signed)
Pt d/c with PICC line per MD order.

## 2012-05-23 NOTE — Progress Notes (Addendum)
   CARE MANAGEMENT NOTE 05/23/2012  Patient:  John [name], John [name]   Account Number:  [id number]  Date Initiated:  05/22/2012  Documentation initiated by:  ROYAL,CHERYL  Subjective/Objective Assessment:   Referral for [facility] to provide continuous IV fluids via PICC.     Action/Plan:   Spoke with [facility] re continuous IV fluids in the home. Additional insurance information required and sent. GoodPossibility of this being approved by insurance , await specific orders for fluids and rate.   Anticipated DC Date:  05/25/2012   Anticipated DC Plan:  HOME W HOME HEALTH SERVICES      DC Planning Services  CM consult      [facility] Choice  HOME HEALTH   Choice offered to / List presented to:  C-1 Patient   DME arranged  OTHER - SEE COMMENT  IV PUMP/EQUIPMENT      DME agency  [facility]     [facility] arranged  HH-1 RN      [facility] agency  [facility]   Status of service:  Completed, signed off Medicare Important Message given?   (If response is "NO", the following Medicare IM given date fields will be blank) Date Medicare IM given:   Date Additional Medicare IM given:    Discharge Disposition:  HOME W HOME HEALTH SERVICES  Per UR Regulation:    If discussed at Long Length of Stay Meetings, dates discussed:    Comments: 05/23/2012 1300 NCM spoke to [facility] rep, and pt is on schedule for 4-5 pm for set up for IV fluids. Isidoro Donning RN CCM Case Mgmt phone [phone]    05/23/2012 1115 NCM spoke to pt and states he is agreeable to [facility] for IV fluids at home. Contacted [facility] for IV fluids. Will need rate and fluids. Orders received. Faxed to [facility] order for IV fluids. Added [facility] info on d/c instructions. Spoke to Leroy, [facility] rep. Will call back with info on [facility] RN start time. Isidoro Donning RN CCM Case Mgmt phone [phone]  05/22/2012 Order for continuous IV fluids via PICC in the home, spoke with [facility], they required insurance group number , this is [phone], this was provided to [facility] . At  this time the [facility] pharmacist thinks that this may be covered. Await specific orders for fluid type and rate. Johny Shock RN MPH Case Manager

## 2012-05-23 NOTE — Progress Notes (Addendum)
Orthostatic Vs's done this am. BP's lying=115/81, sitting=100/75, standing=91/63. Improved from yesterday am with continuous IV fluids via PICC.

## 2012-05-23 NOTE — Discharge Summary (Signed)
Physician Discharge Summary  John [name] ZOX:[id number] DOB: 15-Oct-1940 DOA: 05/21/2012  PCP: [name], MD  Admit date: 05/21/2012 Discharge date: 05/23/2012  Recommendations for Outpatient Follow-up:  Patient is being sent home with a PICC line and continuous IV fluids.  Patient has been advised that he is not allowed to drive for at least the next 6 months if not longer until his anemia has resolved and he subcutaneous more stable as per his PCP  Discharge Condition: mild improvement given hydration and blood pressures, but overall still quite at risk for syncopal event or ischemia due to anemia  Diet recommendation: low sodium  Filed Weights   05/22/12 0251  Weight: 78.5 kg (173 lb 1 oz)    History of present illness:  71 year old white male past with history of IBS who presents with weakness and found to be anemic with a hemoglobin around 8.0 in his PCPs office. No evidence of acute blood loss looks to be more chronic. Course is complicated by the fact the patient is a Jehovah's Witness and refuses blood transfusions although he is symptomatically hypotensive with his pressure dropping down into the 70s at times.   Hospital Course & Discharge Diagnoses:  Principal Problem:  *Anemia: Anemia: No evidence of acute GI bleed. Patient was worked up, both here in the hospital as well as the office. His ferritin and iron levels are not low. His B12 level is normal. An extensive discussion with hematology in the cause that could indeed be production. Of the patient's medications, and most all of them with the exception of Zoloft can lead to myelosuppression and anemia of different etiologies. The biggest suspicion was for Hydrea given patient's starting that medication the past year. Height is being discontinued. Is not known when exactly he will be able to hopefully turn this around. As mentioned, a complicating factor in this is a patient declined any type of blood transfusion. I had  an extensive discussion with both the patient as well as his wife and explained that even by finding the cause, given the fact that this is not a GI bleed, it is not clear when his bone marrow will start produce more quality red blood cells. It's explained that in the interim, his hemoglobin levels consult was low to the point where he can have a sudden syncopal event and possibly have organ failure or worse death. The patient understands this. Patient's wife understands this. He continued to decline a blood transfusion. The patient is not cleared to drive in any manner. In the interim at least, in regards to his hypotension was felt to be secondary to anemia, and place a PICC line and given placement continuous normal saline which will continue at home with home health. Also add Florinef to his medication regimen to hopefully help this as well.  Active Problems:  BIPOLAR AFFECTIVE DISORDER: Stable. No acute events  Refusal of blood transfusions as patient is Jehovah's Witness  Generalized weakness: See above  Frequent falls: See above  Hypotension: See above, had discontinued his Tenoretic   Procedures:  none  Consultations:  Hematology-Dr. Myna Hidalgo  Discharge Exam: Filed Vitals:   05/22/12 2109 05/23/12 0448 05/23/12 0449 05/23/12 0450  BP: 120/63 115/81 100/75 91/63  Pulse: 65 65 66   Temp: 98.8 F (37.1 C) 97.7 F (36.5 C) 97.7 F (36.5 C)   TempSrc: Oral Oral Oral   Resp: 20 18 18 18   Height:      Weight:      SpO2:  97% 97% 99%     General: feeling a little bit better., No acute distress, tolerating by mouth. Less dizziness today, still easily fatigued Cardiovascular: regular rate and rhythm, S1-S2 Respiratory: clear to auscultation bilaterally Abdomen: Soft, nontender, nondistended, positive bowel sounds Extremities: No clubbing or cyanosis or edema  Discharge Instructions  Discharge Orders    Future Orders Please Complete By Expires   Diet - low sodium heart healthy       Driving Restrictions      Comments:   Patient is not cleared to drive for at least 6 months if not longer (this may be lifted sooner if his anemia issues have resolved and he is been cleared by a physician.)   Increase activity slowly          Medication List     As of 05/23/2012 11:22 AM    STOP taking these medications         hydroxyurea 500 MG capsule   Commonly known as: HYDREA    atenolol-chlorthalidone 50-25 MG per tablet  Commonly known as: TENORETIC  Take 1 tablet by mouth daily.    TAKE these medications         aspirin 325 MG tablet   Take 325 mg by mouth daily.         divalproex 125 MG capsule   Commonly known as: DEPAKOTE SPRINKLE   Take 125-250 mg by mouth. 2 capsules (250mg ) in the morning and bedtime.  1 capsule (125mg ) with lunch and dinner.      fludrocortisone 0.1 MG tablet   Commonly known as: FLORINEF   Take 1 tablet (0.1 mg total) by mouth daily.      RISPERDAL 1 MG tablet   Generic drug: risperiDONE   Take 0.5 mg by mouth daily. Take once daily      sertraline 100 MG tablet   Commonly known as: ZOLOFT   Take 150 mg by mouth every evening.      sodium chloride 0.9 % infusion   Inject 75 mLs into the vein continuous.           Follow-up Information    Follow up with FRY,STEPHEN A, MD. Schedule an appointment as soon as possible for a visit in 1 week.   Contact information:   [address] [place] [address] [phone]           The results of significant diagnostics from this hospitalization (including imaging, microbiology, ancillary and laboratory) are listed below for reference.    Significant Diagnostic Studies: Ct Head Wo Contrast  05/21/2012    IMPRESSION: No evidence of acute intracranial abnormality.  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   Original Report Authenticated By: Charline Bills, M.D.    Dg Abd 2 Views  05/21/2012    IMPRESSION: No evidence of small bowel obstruction or free air.   Original  Report Authenticated By: Charline Bills, M.D.     Labs: Basic Metabolic Panel:  Lab 05/23/12 6440 05/21/12 2020 05/21/12 1502  NA 137 139 142  K 3.9 4.0 4.7  CL 104 103 107  CO2 24 23 25   GLUCOSE 88 94 112*  BUN 30* 35* 35*  CREATININE 1.10 1.23 1.3  CALCIUM 8.8 9.2 9.0  MG -- 1.8 1.8  PHOS -- -- --   Liver Function Tests:  Lab 05/21/12 2020 05/21/12 1502  AST 18 18  ALT 9 12  ALKPHOS 36* 33*  BILITOT 0.3 0.8  PROT 7.3 7.8  ALBUMIN 3.9 4.0  CBC:  Lab 05/23/12 0505 05/21/12 2020 05/21/12 1502  WBC 3.7* 4.5 4.4*  NEUTROABS -- -- 2.1  HGB 6.7* 7.6* 8.0*  HCT 19.2* 21.7* 24.8*  MCV 95.0 95.6 104.2*  PLT 98* 121* 130.0*   BNP: BNP (last 3 results)  Basename 05/22/12 0451  PROBNP 457.4*   CBG: No results found for this basename: GLUCAP:5 in the last 168 hours  Time coordinating discharge: 35 minutes  Signed:  Hollice Espy  [facility] 05/23/2012, 11:22 AM

## 2012-05-23 NOTE — Progress Notes (Signed)
Lab called with Hgb result of 6.7 this am. MD not notified at this time in light of patient refusing blood products. Will inform oncoming RN for continued monitoring. Ginny Forth

## 2012-05-24 ENCOUNTER — Telehealth: Payer: Self-pay | Admitting: Family Medicine

## 2012-05-24 NOTE — Telephone Encounter (Signed)
Patient called stating that he is now out of the hospital and will need an rx for a shower chair and bed side Commode called into [facility]. Please advise/assist.

## 2012-05-24 NOTE — Telephone Encounter (Signed)
He needs to ask the doctor who sent him home for this. This should have been done in the hospital

## 2012-05-24 NOTE — Telephone Encounter (Signed)
Pt already has a nurse coming out from [facility]. Dr. Clent Ridges has recommended that she contact the doctor that wrote the home care to write the script for the equipment, if not the pt will come in here for a office visit.

## 2012-05-25 NOTE — Telephone Encounter (Signed)
Order written for shower bench, bedside commode, and platform walker. Faxed to [facility] and spoke with Leanord Hawking, nurse at [facility]. Equipment will be delivered tomorrow, Thurs at latest. Pt's wife is aware. [facility] nursing and home health will be at their house tomorrow a.m for nurse care and bathing.

## 2012-05-26 LAB — ERYTHROPOIETIN: Erythropoietin: 279 m[IU]/mL — ABNORMAL HIGH (ref 2.6–34.0)

## 2012-05-27 ENCOUNTER — Telehealth: Payer: Self-pay | Admitting: Family Medicine

## 2012-05-27 NOTE — Addendum Note (Signed)
Addended by: Aniceto Boss A on: 05/27/2012 09:21 AM   Modules accepted: Orders

## 2012-05-27 NOTE — Telephone Encounter (Signed)
Pts spouse did not want to talk with CAN. Pts spouse called and said that they are having problems getting the hospitalist - Dr Virginia Rochester, to sign off  On pts continuous saline IV. Pt has pik line. The doctor say that pts pcp, Dry Clent Ridges, has to do it. Pts spouse said that she is  Pt in to ov, because pt is attached to IV. Pt was discharged from hospital with the IV because of BP. Pls call.

## 2012-05-27 NOTE — Telephone Encounter (Signed)
Sheri from [facility] contacted pt about MRI pt feels that the CT he received in the hospital is enough and does not need an MRI. Please advise

## 2012-05-27 NOTE — Telephone Encounter (Signed)
Caller: Jeannette/Spouse; Patient Name: John [name]; PCP: Gershon Crane [facility]); Best Callback Phone Number: [phone].  W[address] Dr[address] name is  Dr. Valli Glance won't give orders to stop Normal Saline so he can come in to see Dr. Clent Ridges, Dr. Clent Ridges can't give orders to stop because he hasn't seen him.  Wife states  she needs permission to hold Normal Saline so he can come in, and also needs an appointment to come in tomorrow to see Dr. Clent Ridges.  Message already in EPIC that wife  has called several times and someone is supposed to call her back, states she did not want to speak to CAN about it, wanted someone in the office, advised her that there  is a message in [facility] for someone to call her back.  Verbalizes understanding.

## 2012-05-28 ENCOUNTER — Ambulatory Visit: Payer: Medicare Other | Admitting: Family Medicine

## 2012-05-28 NOTE — Telephone Encounter (Signed)
I spoke with pt and gave the below information. He is on the schedule for 05/31/12.

## 2012-05-28 NOTE — Telephone Encounter (Signed)
He has an appt with me next Monday, and we will decide about the IV fluids then

## 2012-05-28 NOTE — Telephone Encounter (Signed)
wew will decide about the IV fluids at his appt with me on Monday

## 2012-05-28 NOTE — Telephone Encounter (Signed)
I spoke with pt and gave the below information. He agreed to come in on 05/31/12.

## 2012-05-31 ENCOUNTER — Ambulatory Visit (INDEPENDENT_AMBULATORY_CARE_PROVIDER_SITE_OTHER): Payer: Medicare Other | Admitting: Family Medicine

## 2012-05-31 ENCOUNTER — Encounter: Payer: Self-pay | Admitting: Family Medicine

## 2012-05-31 VITALS — BP 118/70 | HR 97 | Temp 99.0°F

## 2012-05-31 DIAGNOSIS — D649 Anemia, unspecified: Secondary | ICD-10-CM

## 2012-05-31 NOTE — Progress Notes (Signed)
  Subjective:    Patient ID: John [name], male    DOB: March 25, 1941, 71 y.o.   MRN: [id number]  HPI Here to follow up a hospital stay from 05-21-12 to 05-23-12 for anemia and dehydration. This was causing generalized weakness and falling spells. His workup revealed a Hgb of only 8, but his levels of iron stores and folate and B12 were normal. He had been using Hydroxyurea for polycythemia, and it was determined that this was the cause for his anemia. No source of active blood losses were found. He declined any blood products because he is Jehovah Witness. He was started on continuous IV fluids and Florinef, and the Hydroxyurea was stopped. Now he feels much better but is still weak and can only walk for short distances. He has a PICC line in place in the right arm which is clamped off.    Review of Systems  Respiratory: Negative.   Cardiovascular: Negative.   Gastrointestinal: Negative.   Neurological: Positive for weakness.       Objective:   Physical Exam  Constitutional:       In a wheelchair, alert. He looks much better than at our last visit.   Cardiovascular: Normal rate, regular rhythm, normal heart sounds and intact distal pulses.   Pulmonary/Chest: Effort normal and breath sounds normal.          Assessment & Plan:  Anemia and weakness. We will check a CBC and a BMET today. If his electrolytes look okay, we will call Home Health tomorrow to DC the PICC line. I hope his Hgb will start coming back up if we stay off Hydroxyurea. Of course we will keep a close watch on his platelet counts as well.

## 2012-06-01 LAB — CBC WITH DIFFERENTIAL/PLATELET
Basophils Relative: 0.2 % (ref 0.0–3.0)
Eosinophils Absolute: 0 10*3/uL (ref 0.0–0.7)
Hemoglobin: 6.9 g/dL — CL (ref 13.0–17.0)
MCHC: 33.3 g/dL (ref 30.0–36.0)
MCV: 107.1 fl — ABNORMAL HIGH (ref 78.0–100.0)
Monocytes Absolute: 0.4 10*3/uL (ref 0.1–1.0)
Neutro Abs: 4 10*3/uL (ref 1.4–7.7)
RBC: 1.94 Mil/uL — ABNORMAL LOW (ref 4.22–5.81)

## 2012-06-01 LAB — BASIC METABOLIC PANEL
CO2: 25 mEq/L (ref 19–32)
Chloride: 106 mEq/L (ref 96–112)
Creatinine, Ser: 1 mg/dL (ref 0.4–1.5)

## 2012-06-01 NOTE — Progress Notes (Signed)
Quick Note:  Per Dr. Clent Ridges, D/C pic line. I spoke with pt and [facility]ed [facility] to [facility]. ______

## 2012-06-02 ENCOUNTER — Telehealth: Payer: Self-pay | Admitting: Family Medicine

## 2012-06-02 NOTE — Telephone Encounter (Signed)
She was calling to see if he was going to be getting anymore IV fluids.  I looked in Epic and saw note from 06-01-12 that they had notified patient and faxed order to [facility] to discontinue the PICC line so no more fluids are needed.

## 2012-10-11 ENCOUNTER — Encounter: Payer: Self-pay | Admitting: Family Medicine

## 2012-10-11 ENCOUNTER — Ambulatory Visit (INDEPENDENT_AMBULATORY_CARE_PROVIDER_SITE_OTHER): Payer: Medicare Other | Admitting: Family Medicine

## 2012-10-11 VITALS — BP 110/64 | HR 93 | Temp 98.0°F | Wt 189.0 lb

## 2012-10-11 DIAGNOSIS — D473 Essential (hemorrhagic) thrombocythemia: Secondary | ICD-10-CM

## 2012-10-11 DIAGNOSIS — D649 Anemia, unspecified: Secondary | ICD-10-CM

## 2012-10-11 DIAGNOSIS — D45 Polycythemia vera: Secondary | ICD-10-CM

## 2012-10-11 LAB — CBC WITH DIFFERENTIAL/PLATELET
Basophils Absolute: 0 10*3/uL (ref 0.0–0.1)
Eosinophils Absolute: 0.1 10*3/uL (ref 0.0–0.7)
Lymphocytes Relative: 26.9 % (ref 12.0–46.0)
MCHC: 33.2 g/dL (ref 30.0–36.0)
Neutrophils Relative %: 56.1 % (ref 43.0–77.0)
RDW: 18.2 % — ABNORMAL HIGH (ref 11.5–14.6)

## 2012-10-11 LAB — BASIC METABOLIC PANEL
Chloride: 107 mEq/L (ref 96–112)
Creatinine, Ser: 1.3 mg/dL (ref 0.4–1.5)
Potassium: 4.6 mEq/L (ref 3.5–5.1)

## 2012-10-11 NOTE — Progress Notes (Signed)
  Subjective:    Patient ID: John [name], male    DOB: May 11, 1941, 72 y.o.   MRN: [id number]  HPI Here to follow up anemia and other issues. We last saw him 6 months ago, when he was profoundly anemic. This was felt to be a side effect of hydroxyurea which he was taking to treat polycythemia. This was stopped and he has improved ever since. He feels stronger and has no trouble walking or getting up out of chairs or bed. He has not been driving for 6 months, and he and his wife feel he can now resume driving safely.    Review of Systems  Constitutional: Negative.   Respiratory: Negative.   Cardiovascular: Negative.   Neurological: Negative.        Objective:   Physical Exam  Constitutional: He is oriented to person, place, and time. He appears well-developed and well-nourished.  Cardiovascular: Normal rate, regular rhythm, normal heart sounds and intact distal pulses.   Pulmonary/Chest: Effort normal and breath sounds normal.  Neurological: He is alert and oriented to person, place, and time. He displays normal reflexes. No cranial nerve deficit. He exhibits normal muscle tone. Coordination normal.          Assessment & Plan:  He has recovered well since stopping the hydroxyurea. We will check a BMET and a CBC today. He is cleared to drive again immediately.

## 2012-10-12 NOTE — Addendum Note (Signed)
Addended by: Gershon Crane A on: 10/12/2012 10:00 PM   Modules accepted: Orders

## 2012-10-12 NOTE — Progress Notes (Signed)
Quick Note:  I spoke with pt and he would like the referral for someone in [place]. ______

## 2012-10-18 ENCOUNTER — Telehealth: Payer: Self-pay | Admitting: Oncology

## 2012-10-18 NOTE — Telephone Encounter (Signed)
LVOM for pt to return call.  °

## 2012-10-19 ENCOUNTER — Telehealth: Payer: Self-pay | Admitting: Oncology

## 2012-10-19 NOTE — Telephone Encounter (Signed)
Pt called and stated he will speak with Dr. Clent Ridges before scheduling an appt. Will notify referring office.

## 2012-11-05 ENCOUNTER — Telehealth: Payer: Self-pay | Admitting: Family Medicine

## 2012-11-05 NOTE — Telephone Encounter (Signed)
Pt left a voice message, he needed to clarify a medication on his list.

## 2012-11-05 NOTE — Telephone Encounter (Signed)
I spoke with pt and made the corrections to his chart.

## 2013-01-20 ENCOUNTER — Telehealth: Payer: Self-pay | Admitting: Oncology

## 2013-01-20 NOTE — Telephone Encounter (Signed)
S/W PT IN RE NP APPT 07/16 @ 1:30 W/DR. SHADAD REFERRING DR. Clent Ridges DX- ANEMIA WELCOME PACKET MAILED.  PT WAS REFERRED BACK IN MARCH PT CALLED BACK TO SCHEDULE NP APPT.

## 2013-02-18 ENCOUNTER — Other Ambulatory Visit: Payer: Self-pay | Admitting: Oncology

## 2013-02-18 DIAGNOSIS — D649 Anemia, unspecified: Secondary | ICD-10-CM

## 2013-02-23 ENCOUNTER — Encounter: Payer: Self-pay | Admitting: Oncology

## 2013-02-23 ENCOUNTER — Other Ambulatory Visit (HOSPITAL_BASED_OUTPATIENT_CLINIC_OR_DEPARTMENT_OTHER): Payer: Medicare Other | Admitting: Lab

## 2013-02-23 ENCOUNTER — Telehealth: Payer: Self-pay | Admitting: Oncology

## 2013-02-23 ENCOUNTER — Ambulatory Visit (HOSPITAL_BASED_OUTPATIENT_CLINIC_OR_DEPARTMENT_OTHER): Payer: Medicare Other | Admitting: Oncology

## 2013-02-23 ENCOUNTER — Ambulatory Visit (HOSPITAL_BASED_OUTPATIENT_CLINIC_OR_DEPARTMENT_OTHER): Payer: Medicare Other

## 2013-02-23 VITALS — BP 117/64 | HR 85 | Temp 97.6°F | Resp 18 | Ht 70.0 in | Wt 183.6 lb

## 2013-02-23 DIAGNOSIS — D649 Anemia, unspecified: Secondary | ICD-10-CM

## 2013-02-23 DIAGNOSIS — D45 Polycythemia vera: Secondary | ICD-10-CM

## 2013-02-23 DIAGNOSIS — D6949 Other primary thrombocytopenia: Secondary | ICD-10-CM

## 2013-02-23 DIAGNOSIS — D47Z9 Other specified neoplasms of uncertain behavior of lymphoid, hematopoietic and related tissue: Secondary | ICD-10-CM

## 2013-02-23 LAB — COMPREHENSIVE METABOLIC PANEL (CC13)
Albumin: 4 g/dL (ref 3.5–5.0)
CO2: 24 mEq/L (ref 22–29)
Calcium: 9.1 mg/dL (ref 8.4–10.4)
Chloride: 108 mEq/L (ref 98–109)
Glucose: 88 mg/dl (ref 70–140)
Sodium: 140 mEq/L (ref 136–145)
Total Bilirubin: 0.32 mg/dL (ref 0.20–1.20)
Total Protein: 7.2 g/dL (ref 6.4–8.3)

## 2013-02-23 LAB — IRON AND TIBC CHCC
%SAT: 40 % (ref 20–55)
Iron: 110 ug/dL (ref 42–163)

## 2013-02-23 LAB — CBC WITH DIFFERENTIAL/PLATELET
Eosinophils Absolute: 0.1 10*3/uL (ref 0.0–0.5)
HCT: 26.2 % — ABNORMAL LOW (ref 38.4–49.9)
LYMPH%: 25.3 % (ref 14.0–49.0)
MONO#: 1.2 10*3/uL — ABNORMAL HIGH (ref 0.1–0.9)
NEUT#: 5.1 10*3/uL (ref 1.5–6.5)
Platelets: 671 10*3/uL — ABNORMAL HIGH (ref 140–400)
RBC: 2.9 10*6/uL — ABNORMAL LOW (ref 4.20–5.82)
WBC: 8.7 10*3/uL (ref 4.0–10.3)
lymph#: 2.2 10*3/uL (ref 0.9–3.3)

## 2013-02-23 LAB — FERRITIN CHCC: Ferritin: 256 ng/ml (ref 22–316)

## 2013-02-23 NOTE — Telephone Encounter (Signed)
gv adn printed appt sched and avs forl pt. °

## 2013-02-23 NOTE — Progress Notes (Signed)
Checked in new patient. No financial issues. He wants phone and mail only. Didn't ask if living will/POA.

## 2013-02-23 NOTE — Progress Notes (Signed)
Reason for Referral: Thrombocytosis.   HPI: John [name] is a pleasant 72 year old gentleman native [place] where he lived the majority of his life. 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 Gentleman with past medical history that is significant for  CVA last of which about 2 years ago a left him with weakness in his left arm. Patient was also developed constellation of GI symptoms that includes diarrhea and questionable irritable bowel syndrome. He was evaluated at [facility] and was found to have thrombocytosis. He was seen at [facility] hematology oncology by Dr. Gretel Acre and underwent a bone marrow biopsy on 03/11/2012. A biopsy confirmed a hypercellular marrow with 80% cellularity and findings suggestive of myeloproliferative disorder. His platelet count was around 900,000 he was also found to have JAK 2 mutation positive. He was started on hydroxyurea and tolerated it poorly. He developed pancytopenia and required hospitalization back in October of 2013. He stopped the medication and has not been back in followup since that time. He was referred to to me by his primary care physician to establish care regarding this condition.  Clinically, he had been doing relatively well without any recent illnesses or hospitalization. He had not had any recent strokes or thrombosis. Had not had any bleeding episodes. He has unchanged quality of life and no new complaints.   Past Medical History  Diagnosis Date  . GERD (gastroesophageal reflux disease)   . HTN (hypertension)   . Diverticulitis     history of  . Diverticulosis of colon   . Reflex sympathetic dystrophy of the arm     left arm  . 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 IBS (irritable bowel syndrome)     see Dr. Leone Payor  . Bipolar disorder     sees Dr. Georgina Pillion in [place]  . Hemorrhoids, internal   . Hemorrhoids, external   . Gastritis    . Arthritis   . Depression   . Tubular adenoma 05/08/2009  . Anxiety states   . Pelvic floor dysfunction   . Colon polyps   . Thrombocythemia, essential     sees Dr. Kerrin Champagne at [facility]   :  Past Surgical History  Procedure Laterality Date  . Appendectomy    . Left elbow surgery x 2      x 2  . Carpal tunnel release      left arm  . Spine surgery    . Tonsillectomy    . Colonoscopy  05/08/2009    adenoma, severe sigmid diverticulosis, int hemorrhoids, NL  random colon bxs and terminal ileum  . Esophagogastroduodenoscopy  10/04/2002    gastritis  :  Current Outpatient Prescriptions  Medication Sig Dispense Refill  . aspirin 325 MG tablet Take 325 mg by mouth daily.        . divalproex (DEPAKOTE SPRINKLE) 125 MG capsule Take 125-250 mg by mouth. 2 capsules (250mg ) in the morning and bedtime. 1 capsule (125mg ) with lunch and dinner.      . risperiDONE (RISPERDAL) 1 MG tablet Take 1 mg by mouth daily. Take once daily      . sertraline (ZOLOFT) 100 MG tablet Take 150 mg by mouth every evening.        No current facility-administered medications for this visit.       Allergies  Allergen Reactions  . Hydrocodone     REACTION: constipation  .  Latex     REACTION: rash  :  Family History  Problem Relation Age of Onset  . Ovarian cancer Mother   . Celiac disease Brother   . Alcohol abuse Father   . Colon cancer Neg Hx   :  History   Social History  . Marital Status: Legally Separated    Spouse Name: N/A    Number of Children: 2  . Years of Education: N/A   Occupational History  . Retired    Social History Main Topics  . Smoking status: Former Games developer  . Smokeless tobacco: Never Used  . Alcohol Use: No  . Drug Use: No  . Sexually Active: Not on file   Other Topics Concern  . Not on file   Social History Narrative   Daily caffeine use: 4 cups of coffee daily.   Patient does not get regular exercise              :  A comprehensive review of  systems was negative.  Exam: ECOG 1 Blood pressure 117/64, pulse 85, temperature 97.6 F (36.4 C), temperature source Oral, resp. rate 18, height 5\' 10"  (1.778 m), weight 183 lb 9.6 oz (83.28 kg). General appearance: alert and cooperative Head: Normocephalic, without obvious abnormality, atraumatic Throat: lips, mucosa, and tongue normal; teeth and gums normal Resp: clear to auscultation bilaterally Chest wall: no tenderness Cardio: regular rate and rhythm, S1, S2 normal, no murmur, click, rub or gallop GI: soft, non-tender; bowel sounds normal; no masses,  no organomegaly Extremities: extremities normal, atraumatic, no cyanosis or edema Skin: Skin color, texture, turgor normal. No rashes or lesions   Recent Labs  02/23/13 1347  WBC 8.7  HGB 9.1*  HCT 26.2*  PLT 671*     Blood smear review: Large platelets noted.      Assessment and Plan:   72 year old gentleman with the following issues:  1. 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 His platelet count is around 600,000 and he is asymptomatic. I discussed with him the natural course of myeloproliferative disorder as well as a complication that can ensue that includes thrombosis, strokes, myocardial infarctions and possible bleeding. I discussed with them treatment options at this time which include restarted hydroxyurea or using anagrelide versus observation. At this time, we have elected to proceed with observation only and intervene with an antiplatelet agent his platelet count goes above 800,000. He understands that he does have a risk of increased thrombosis but hoping to avoid antiplatelet agents due to complications he had with hydroxyurea previous.  2. Thrombosis prophylaxis: He is ready on full dose aspirin without any bleeding complications which I recommended to continue.  3. Anemia: His hemoglobin is improving I am checking a repeat anemia panel including iron studies as well as  erythropoietin level to evaluate for anemia of chronic disease.  We will followup in 3 months time and we will check his blood counts and make a decision regarding further treatment at that time.

## 2013-02-25 LAB — SPEP & IFE WITH QIG
Albumin ELP: 59.9 % (ref 55.8–66.1)
IgA: 277 mg/dL (ref 68–379)
IgG (Immunoglobin G), Serum: 1190 mg/dL (ref 650–1600)
IgM, Serum: 96 mg/dL (ref 41–251)
Total Protein, Serum Electrophoresis: 7.3 g/dL (ref 6.0–8.3)

## 2013-05-26 ENCOUNTER — Other Ambulatory Visit (HOSPITAL_BASED_OUTPATIENT_CLINIC_OR_DEPARTMENT_OTHER): Payer: Medicare Other | Admitting: Lab

## 2013-05-26 ENCOUNTER — Ambulatory Visit (HOSPITAL_BASED_OUTPATIENT_CLINIC_OR_DEPARTMENT_OTHER): Payer: Medicare Other | Admitting: Oncology

## 2013-05-26 VITALS — BP 112/63 | HR 73 | Temp 97.1°F | Resp 19 | Ht 70.0 in | Wt 180.3 lb

## 2013-05-26 DIAGNOSIS — Z7982 Long term (current) use of aspirin: Secondary | ICD-10-CM

## 2013-05-26 DIAGNOSIS — D47Z9 Other specified neoplasms of uncertain behavior of lymphoid, hematopoietic and related tissue: Secondary | ICD-10-CM

## 2013-05-26 DIAGNOSIS — D45 Polycythemia vera: Secondary | ICD-10-CM

## 2013-05-26 LAB — CBC WITH DIFFERENTIAL/PLATELET
BASO%: 1.7 % (ref 0.0–2.0)
Eosinophils Absolute: 0 10*3/uL (ref 0.0–0.5)
LYMPH%: 21 % (ref 14.0–49.0)
MCHC: 34.3 g/dL (ref 32.0–36.0)
MONO#: 1.6 10*3/uL — ABNORMAL HIGH (ref 0.1–0.9)
MONO%: 15.3 % — ABNORMAL HIGH (ref 0.0–14.0)
NEUT#: 6.3 10*3/uL (ref 1.5–6.5)
RBC: 2.73 10*6/uL — ABNORMAL LOW (ref 4.20–5.82)
RDW: 21.1 % — ABNORMAL HIGH (ref 11.0–14.6)
WBC: 10.2 10*3/uL (ref 4.0–10.3)

## 2013-05-26 LAB — COMPREHENSIVE METABOLIC PANEL (CC13)
ALT: 9 U/L (ref 0–55)
Albumin: 3.9 g/dL (ref 3.5–5.0)
Alkaline Phosphatase: 33 U/L — ABNORMAL LOW (ref 40–150)
CO2: 24 mEq/L (ref 22–29)
Glucose: 86 mg/dl (ref 70–140)
Potassium: 4.4 mEq/L (ref 3.5–5.1)
Sodium: 140 mEq/L (ref 136–145)
Total Bilirubin: 0.32 mg/dL (ref 0.20–1.20)
Total Protein: 7.4 g/dL (ref 6.4–8.3)

## 2013-05-26 NOTE — Progress Notes (Signed)
Hematology and Oncology Follow Up Visit  John [name] [id number] 11/15/1940 72 y.o. 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 He was diagnosed at [facility] hematology oncology by John [name] and underwent a bone marrow biopsy on 03/11/2012  Prior Therapy: He was started on hydroxyurea and tolerated it poorly in 2013. He developed pancytopenia and required hospitalization back in October of 2013   Current therapy: Observation and follow up.   Interim History: John [name] presents today for a followup visit. He is a pleasant gentleman who returns today with his wife to discuss his laboratory data. Since his last visit, he continues to have GI problems including abdominal distention, diarrhea and occasional hematochezia. He is not reporting any fatigue or tiredness. Is not reporting any chest pain or shortness of breath. He follows with gastroenterology regularly and he was recommended to take by mouth iron which she has not done consistently. He has not reported any other bleeding symptoms. For the most part performance status has not really changed dramatically.  Medications: I have reviewed the patient's current medications.  Current Outpatient Prescriptions  Medication Sig Dispense Refill  . aspirin 325 MG tablet Take 325 mg by mouth daily.        . divalproex (DEPAKOTE SPRINKLE) 125 MG capsule Take 125-250 mg by mouth. 2 capsules (250mg ) in the morning and bedtime. 1 capsule (125mg ) with lunch and dinner.      . risperiDONE (RISPERDAL) 1 MG tablet Take 1 mg by mouth daily. Take once daily      . sertraline (ZOLOFT) 100 MG tablet Take 150 mg by mouth every evening.        No current facility-administered medications for this visit.     Allergies:  Allergies  Allergen Reactions  . Hydrocodone    REACTION: constipation  . Latex     REACTION: rash    Past Medical History, Surgical history, Social history, and Family History were reviewed and updated.  Review of Systems:  Remaining ROS negative. Physical Exam: Blood pressure 112/63, pulse 73, temperature 97.1 F (36.2 C), temperature source Oral, resp. rate 19, height 5\' 10"  (1.778 m), weight 180 lb 4.8 oz (81.784 kg). ECOG: 1 General appearance: alert, cooperative and appears stated age appeared slightly pale. Head: Normocephalic, without obvious abnormality, atraumatic Neck: no adenopathy, no carotid bruit, no JVD, supple, symmetrical, trachea midline and thyroid not enlarged, symmetric, no tenderness/mass/nodules Lymph nodes: Cervical, supraclavicular, and axillary nodes normal. Heart:regular rate and rhythm, S1, S2 normal, no murmur, click, rub or gallop Lung:chest clear, no wheezing, rales, normal symmetric air entry Abdomin: soft, non-tender, without masses or organomegaly EXT:no erythema, induration, or nodules   Lab Results: Lab Results  Component Value Date   WBC 10.2 05/26/2013   HGB 8.5* 05/26/2013   HCT 24.9* 05/26/2013   MCV 91.3 05/26/2013   PLT 741* 05/26/2013     Chemistry      Component Value Date/Time   NA 140 02/23/2013 1347   NA 141 10/11/2012 1448   K 4.5 02/23/2013 1347   K 4.6 10/11/2012 1448   CL 107 10/11/2012 1448   CO2 24 02/23/2013 1347   CO2 25 10/11/2012 1448   BUN 18.6 02/23/2013 1347   BUN 19 10/11/2012 1448   CREATININE 1.3 02/23/2013 1347   CREATININE 1.3 10/11/2012 1448      Component Value Date/Time   CALCIUM 9.1  02/23/2013 1347   CALCIUM 9.2 10/11/2012 1448   ALKPHOS 35* 02/23/2013 1347   ALKPHOS 36* 05/21/2012 2020   AST 19 02/23/2013 1347   AST 18 05/21/2012 2020   ALT 9 02/23/2013 1347   ALT 9 05/21/2012 2020   BILITOT 0.32 02/23/2013 1347   BILITOT 0.3 05/21/2012 2020      Impression and Plan:   72 year old gentleman with the following issues:  1. Myeloproliferative disorder  presenting with essential thrombocythemia. His platelet count is around 600,000 and he is asymptomatic.He understands that he does have a risk of increased thrombosis but hoping to avoid antiplatelet agents due to complications he had with hydroxyurea previous.   2. Thrombosis prophylaxis: He is ready on full dose aspirin without any bleeding complications which I recommended to continue.   3. Anemia: His hemoglobin has not recovered completely. This might be due to blood loss or bone marrow suppression I have offered him blood transfusions, IV iron infusion as well as by mouth iron supplements which she agreed to take for the time being.  4.Followup in 3 months time and we will check his blood counts and make a decision regarding further treatment at that time.        John Hose, MD 10/16/20143:20 PM

## 2013-06-13 ENCOUNTER — Telehealth: Payer: Self-pay | Admitting: Oncology

## 2013-06-13 NOTE — Telephone Encounter (Signed)
pt adv after 10/16 appt w Dr Clelia Croft we indicated we would call with next appt in 6 mos.  No POF enetered.  Email to Dr. Clelia Croft to adv when he wants this pt to follow up.  [facility]

## 2013-06-14 ENCOUNTER — Telehealth: Payer: Self-pay | Admitting: Oncology

## 2013-06-14 ENCOUNTER — Other Ambulatory Visit: Payer: Self-pay | Admitting: Oncology

## 2013-06-14 NOTE — Telephone Encounter (Signed)
LVMM adv of 04/15 appts cal mailed shh

## 2013-11-18 ENCOUNTER — Encounter: Payer: Self-pay | Admitting: Oncology

## 2013-11-18 ENCOUNTER — Other Ambulatory Visit (HOSPITAL_BASED_OUTPATIENT_CLINIC_OR_DEPARTMENT_OTHER): Payer: Medicare Other

## 2013-11-18 ENCOUNTER — Telehealth: Payer: Self-pay | Admitting: Oncology

## 2013-11-18 ENCOUNTER — Ambulatory Visit (HOSPITAL_BASED_OUTPATIENT_CLINIC_OR_DEPARTMENT_OTHER): Payer: Medicare Other | Admitting: Oncology

## 2013-11-18 VITALS — BP 117/67 | HR 101 | Temp 98.1°F | Resp 19 | Ht 70.0 in | Wt 181.0 lb

## 2013-11-18 DIAGNOSIS — D649 Anemia, unspecified: Secondary | ICD-10-CM

## 2013-11-18 DIAGNOSIS — D473 Essential (hemorrhagic) thrombocythemia: Secondary | ICD-10-CM

## 2013-11-18 DIAGNOSIS — D45 Polycythemia vera: Secondary | ICD-10-CM

## 2013-11-18 DIAGNOSIS — D47Z9 Other specified neoplasms of uncertain behavior of lymphoid, hematopoietic and related tissue: Secondary | ICD-10-CM

## 2013-11-18 LAB — CBC WITH DIFFERENTIAL/PLATELET
BASO%: 0.6 % (ref 0.0–2.0)
BASOS ABS: 0.1 10*3/uL (ref 0.0–0.1)
EOS ABS: 0.1 10*3/uL (ref 0.0–0.5)
EOS%: 0.3 % (ref 0.0–7.0)
HCT: 27.2 % — ABNORMAL LOW (ref 38.4–49.9)
HEMOGLOBIN: 8.7 g/dL — AB (ref 13.0–17.1)
LYMPH#: 2.7 10*3/uL (ref 0.9–3.3)
LYMPH%: 17.2 % (ref 14.0–49.0)
MCH: 29 pg (ref 27.2–33.4)
MCHC: 32.1 g/dL (ref 32.0–36.0)
MCV: 90.2 fL (ref 79.3–98.0)
MONO#: 2.1 10*3/uL — AB (ref 0.1–0.9)
MONO%: 13.5 % (ref 0.0–14.0)
NEUT%: 68.4 % (ref 39.0–75.0)
NEUTROS ABS: 10.6 10*3/uL — AB (ref 1.5–6.5)
Platelets: 753 10*3/uL — ABNORMAL HIGH (ref 140–400)
RBC: 3.02 10*6/uL — ABNORMAL LOW (ref 4.20–5.82)
RDW: 20.3 % — AB (ref 11.0–14.6)
WBC: 15.5 10*3/uL — ABNORMAL HIGH (ref 4.0–10.3)

## 2013-11-18 LAB — IRON AND TIBC CHCC
%SAT: 38 % (ref 20–55)
IRON: 111 ug/dL (ref 42–163)
TIBC: 291 ug/dL (ref 202–409)
UIBC: 180 ug/dL (ref 117–376)

## 2013-11-18 LAB — COMPREHENSIVE METABOLIC PANEL (CC13)
ALBUMIN: 4.3 g/dL (ref 3.5–5.0)
ALT: 8 U/L (ref 0–55)
AST: 24 U/L (ref 5–34)
Alkaline Phosphatase: 41 U/L (ref 40–150)
Anion Gap: 9 mEq/L (ref 3–11)
BUN: 21.8 mg/dL (ref 7.0–26.0)
CALCIUM: 9.1 mg/dL (ref 8.4–10.4)
CHLORIDE: 110 meq/L — AB (ref 98–109)
CO2: 21 meq/L — AB (ref 22–29)
Creatinine: 1.4 mg/dL — ABNORMAL HIGH (ref 0.7–1.3)
GLUCOSE: 92 mg/dL (ref 70–140)
POTASSIUM: 4.6 meq/L (ref 3.5–5.1)
Sodium: 140 mEq/L (ref 136–145)
Total Bilirubin: 0.29 mg/dL (ref 0.20–1.20)
Total Protein: 7.7 g/dL (ref 6.4–8.3)

## 2013-11-18 LAB — TECHNOLOGIST REVIEW

## 2013-11-18 LAB — FERRITIN CHCC: Ferritin: 288 ng/ml (ref 22–316)

## 2013-11-18 NOTE — Progress Notes (Signed)
Hematology and Oncology Follow Up Visit  John [name] [id number] Dec 21, 1940 73 y.o. 11/18/2013 3:01 PM Laurey Morale, MDFry, Ishmael Holter, MD   Principle Diagnosis: 73 year old gentleman with Myeloproliferative disorder presenting with essential thrombocythemia diagnosed in 03/2012. He was diagnosed at [facility] hematology oncology by Dr. Rudean Hitt and underwent a bone marrow biopsy on 03/11/2012  Prior Therapy: He was started on hydroxyurea and tolerated it poorly in 2013. He developed pancytopenia and required hospitalization back in October of 2013   Current therapy: Observation and follow up.   Interim History: [name] presents today for a followup visit. He is a pleasant gentleman who returns today with his wife to discuss his laboratory data. Since his last visit, he is not reporting any new symptoms or hospitalizations . He is not reporting any fatigue or tiredness. Is not reporting any chest pain or shortness of breath. He follows with gastroenterology regularly and he was recommended to take by mouth iron which he has done more consistently. He has not reported any other bleeding symptoms. For the most part performance status has not really changed dramatically. He has not reported any thrombosis or bleeding.  Medications: I have reviewed the patient's current medications.  Current Outpatient Prescriptions  Medication Sig Dispense Refill  . aspirin 325 MG tablet Take 325 mg by mouth daily.        . divalproex (DEPAKOTE SPRINKLE) 125 MG capsule Take 125-250 mg by mouth. 2 capsules (213m) in the morning and bedtime. 1 capsule (1259m with lunch and dinner.      . Ferrous Sulfate Dried 200 (65 FE) MG TABS Take 65 mg by mouth daily.      . Multiple Vitamins-Minerals (CENTRUM ADULTS PO) Take 1 tablet by mouth.      . risperiDONE (RISPERDAL) 1 MG tablet Take 1 mg by mouth daily. Take once daily      . sertraline (ZOLOFT) 100 MG tablet Take 150 mg by mouth every  evening.        No current facility-administered medications for this visit.     Allergies:  Allergies  Allergen Reactions  . Hydrocodone     REACTION: constipation  . Latex     REACTION: rash    Past Medical History, Surgical history, Social history, and Family History were reviewed and updated.  Review of Systems:  Remaining ROS negative. Physical Exam: Blood pressure 117/67, pulse 101, temperature 98.1 F (36.7 C), temperature source Oral, resp. rate 19, height _0  (1.778 m), weight 181 lb (82.101 kg). ECOG: 1 General appearance: alert, cooperative and appears stated age appeared slightly pale. Head: Normocephalic, without obvious abnormality, atraumatic Neck: no adenopathy, no carotid bruit, no JVD, supple, symmetrical, trachea midline and thyroid not enlarged, symmetric, no tenderness/mass/nodules Lymph nodes: Cervical, supraclavicular, and axillary nodes normal. Heart:regular rate and rhythm, S1, S2 normal, no murmur, click, rub or gallop Lung:chest clear, no wheezing, rales, normal symmetric air entry Abdomin: soft, non-tender, without masses or organomegaly EXT:no erythema, induration, or nodules   Lab Results: Lab Results  Component Value Date   WBC 15.5* 11/18/2013   HGB 8.7* 11/18/2013   HCT 27.2* 11/18/2013   MCV 90.2 11/18/2013   PLT 753* 11/18/2013     Chemistry      Component Value Date/Time   NA 140 05/26/2013 1434   NA 141 10/11/2012 1448   K 4.4 05/26/2013 1434   K 4.6 10/11/2012 1448   CL 107 10/11/2012 1448   CO2 24 05/26/2013 1434  CO2 25 10/11/2012 1448   BUN 18.2 05/26/2013 1434   BUN 19 10/11/2012 1448   CREATININE 1.2 05/26/2013 1434   CREATININE 1.3 10/11/2012 1448      Component Value Date/Time   CALCIUM 9.0 05/26/2013 1434   CALCIUM 9.2 10/11/2012 1448   ALKPHOS 33* 05/26/2013 1434   ALKPHOS 36* 05/21/2012 2020   AST 19 05/26/2013 1434   AST 18 05/21/2012 2020   ALT 9 05/26/2013 1434   ALT 9 05/21/2012 2020   BILITOT 0.32 05/26/2013 1434    BILITOT 0.3 05/21/2012 2020      Impression and Plan:   74 year old gentleman with the following issues:  1. 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 His platelet count is around 700,000 and he is asymptomatic.He understands that he does have a risk of increased thrombosis but hoping to avoid antiplatelet agents due to complications he had with hydroxyurea previous.   2. Thrombosis prophylaxis: He is ready on full dose aspirin without any bleeding complications which I recommended to continue.   3. Anemia: His hemoglobin has not recovered completely. This might be due to blood loss or bone marrow suppression I have offered him blood transfusions, IV iron infusion as well as by mouth iron supplements which she agreed to take for the time being.  4.Followup in 8 months.        Wyatt Portela, MD 4/10/20153:01 PM

## 2013-11-18 NOTE — Telephone Encounter (Signed)
Gave pt appt for lab and MD on December 2015 °

## 2013-11-30 ENCOUNTER — Ambulatory Visit: Payer: Medicare Other | Admitting: Family Medicine

## 2013-12-02 ENCOUNTER — Ambulatory Visit (INDEPENDENT_AMBULATORY_CARE_PROVIDER_SITE_OTHER): Payer: Medicare Other | Admitting: Family Medicine

## 2013-12-02 ENCOUNTER — Encounter: Payer: Self-pay | Admitting: Family Medicine

## 2013-12-02 VITALS — BP 113/58 | HR 71 | Temp 98.0°F | Ht 70.0 in | Wt 179.5 lb

## 2013-12-02 DIAGNOSIS — Z01818 Encounter for other preprocedural examination: Secondary | ICD-10-CM

## 2013-12-02 DIAGNOSIS — D471 Chronic myeloproliferative disease: Secondary | ICD-10-CM | POA: Insufficient documentation

## 2013-12-02 DIAGNOSIS — D47Z9 Other specified neoplasms of uncertain behavior of lymphoid, hematopoietic and related tissue: Secondary | ICD-10-CM

## 2013-12-02 NOTE — Progress Notes (Signed)
Pre visit review using our clinic review tool, if applicable. No additional management support is needed unless otherwise documented below in the visit note. 

## 2013-12-02 NOTE — Progress Notes (Signed)
   Subjective:    Patient ID: John [name], male    DOB: 1941/03/15, 73 y.o.   MRN: [id number]  HPI Here for pre-operative evaluation prior to planned cataract extractions per Dr. Tommy Rainwater. The right eye is scheduled to be done first on 12-19-13 and the left eye will be done after that. He feels fine in general. He sees Dr. 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 He is on daily oral iron supplementation. His recent Hgb on 11-18-13 was 8.7, which is up from 8.5 last October.    Review of Systems  Constitutional: Negative.   HENT: Negative.   Eyes: Positive for visual disturbance. Negative for photophobia, pain, discharge, redness and itching.  Respiratory: Negative.   Cardiovascular: Negative.   Gastrointestinal: Negative.   Genitourinary: Negative.   Musculoskeletal: Negative.   Skin: Negative.   Neurological: Negative.   Hematological: Negative for adenopathy. Does not bruise/bleed easily.  Psychiatric/Behavioral: Negative.        Objective:   Physical Exam  Constitutional: He is oriented to person, place, and time. He appears well-developed and well-nourished. No distress.  HENT:  Head: Normocephalic and atraumatic.  Right Ear: External ear normal.  Left Ear: External ear normal.  Nose: Nose normal.  Mouth/Throat: Oropharynx is clear and moist. No oropharyngeal exudate.  Eyes: Conjunctivae and EOM are normal. Pupils are equal, round, and reactive to light. Right eye exhibits no discharge. Left eye exhibits no discharge. No scleral icterus.  Neck: Neck supple. No JVD present. No tracheal deviation present. No thyromegaly present.  Cardiovascular: Normal rate, regular rhythm, normal heart sounds and intact distal pulses.  Exam reveals no gallop and no friction rub.   No murmur heard. EKG normal   Pulmonary/Chest: Effort normal and breath sounds normal. No respiratory distress. He has no wheezes. He has no rales. He exhibits no tenderness.   Abdominal: Soft. Bowel sounds are normal. He exhibits no distension and no mass. There is no tenderness. There is no rebound and no guarding.  Musculoskeletal: Normal range of motion. He exhibits no edema and no tenderness.  Lymphadenopathy:    He has no cervical adenopathy.  Neurological: He is alert and oriented to person, place, and time. He has normal reflexes. No cranial nerve deficit. He exhibits normal muscle tone. Coordination normal.  Skin: Skin is warm and dry. No rash noted. He is not diaphoretic. No erythema. No pallor.          Assessment & Plan:  Unremarkable exam. He is cleared for surgery.

## 2014-04-26 ENCOUNTER — Ambulatory Visit: Payer: Medicare Other | Admitting: Family Medicine

## 2014-04-27 ENCOUNTER — Encounter: Payer: Self-pay | Admitting: Internal Medicine

## 2014-06-14 ENCOUNTER — Encounter: Payer: Self-pay | Admitting: Internal Medicine

## 2014-07-10 ENCOUNTER — Telehealth: Payer: Self-pay

## 2014-07-10 NOTE — Telephone Encounter (Signed)
Called pt to advise time for AWV for 2015, pt declines and will call back 2016 due to so many other appointments.

## 2014-07-20 ENCOUNTER — Other Ambulatory Visit: Payer: Medicare Other

## 2014-07-20 ENCOUNTER — Telehealth: Payer: Self-pay | Admitting: Oncology

## 2014-07-20 ENCOUNTER — Ambulatory Visit: Payer: Medicare Other | Admitting: Oncology

## 2014-07-20 NOTE — Telephone Encounter (Signed)
Pt called to r/s labs/ov pt not feeling well r/s for first of next year.... Mailed sch... KJ

## 2014-08-15 ENCOUNTER — Telehealth: Payer: Self-pay | Admitting: Oncology

## 2014-08-15 ENCOUNTER — Ambulatory Visit: Payer: Medicare Other | Admitting: Oncology

## 2014-08-15 ENCOUNTER — Other Ambulatory Visit: Payer: Medicare Other

## 2014-08-15 NOTE — Telephone Encounter (Signed)
returned pt's call re cx appt for today. s/w pt and he does not wish to r/s at this time. per pt he will call to r/s if he wants appt. message to FS.

## 2015-03-27 ENCOUNTER — Encounter: Payer: Self-pay | Admitting: Family Medicine

## 2015-03-27 ENCOUNTER — Ambulatory Visit (INDEPENDENT_AMBULATORY_CARE_PROVIDER_SITE_OTHER): Payer: Medicare Other | Admitting: Family Medicine

## 2015-03-27 VITALS — BP 108/63 | HR 73 | Temp 97.9°F

## 2015-03-27 DIAGNOSIS — L0212 Furuncle of neck: Secondary | ICD-10-CM

## 2015-03-27 DIAGNOSIS — G90512 Complex regional pain syndrome I of left upper limb: Secondary | ICD-10-CM | POA: Diagnosis not present

## 2015-03-27 DIAGNOSIS — L0213 Carbuncle of neck: Secondary | ICD-10-CM | POA: Diagnosis not present

## 2015-03-27 MED ORDER — DOXYCYCLINE HYCLATE 100 MG PO CAPS: 100.0000 mg | ORAL_CAPSULE | Freq: Two times a day (BID) | ORAL | Status: AC

## 2015-03-27 NOTE — Progress Notes (Signed)
   Subjective:    Patient ID: John [name], male    DOB: 1941-01-18, 74 y.o.   MRN: [id number]  HPI Here for 2 problems. 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 His fingernails are also digging into the palm causing lacerations. He has tried keeping the fingers propped open with hand cloths etc but this does not work. He had a series of Botox injections at [facility] several years ago but these did not help much either. Also he has had a cyst on the back of the neck for several years but 2 weeks ago these swelled up, became painful and started draining yellow fluid. No fever.  Review of Systems  Constitutional: Negative.   Respiratory: Negative.   Cardiovascular: Negative.   Skin: Positive for wound.       Objective:   Physical Exam  Constitutional:  Weak, walks without assistance  Cardiovascular: Normal rate, regular rhythm, normal heart sounds and intact distal pulses.   Pulmonary/Chest: Effort normal and breath sounds normal.  Neurological:  His left arm is flexed and the hand is clenched tightly shut. The skin of the palm is breaking down and macerated. His nails are digging into the palm  Skin:  The posterior neck has a large tender boil which is oozing yellow purulent fluid          Assessment & Plan:  Treat the boil with Doxycycline, but this will also need a deeper incision and drainage procedure. Refer to Surgery. 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 Refer to Hand Surgery.

## 2015-03-27 NOTE — Progress Notes (Signed)
Pre visit review using our clinic review tool, if applicable. No additional management support is needed unless otherwise documented below in the visit note. Pt unable to weigh 

## 2015-03-30 DIAGNOSIS — L723 Sebaceous cyst: Secondary | ICD-10-CM | POA: Diagnosis not present

## 2015-03-30 DIAGNOSIS — L089 Local infection of the skin and subcutaneous tissue, unspecified: Secondary | ICD-10-CM | POA: Diagnosis not present

## 2015-07-31 ENCOUNTER — Encounter: Payer: Self-pay | Admitting: Family Medicine

## 2015-07-31 ENCOUNTER — Ambulatory Visit (INDEPENDENT_AMBULATORY_CARE_PROVIDER_SITE_OTHER): Payer: Medicare Other | Admitting: Family Medicine

## 2015-07-31 VITALS — BP 117/68 | HR 70 | Temp 98.7°F | Ht 70.0 in | Wt 166.0 lb

## 2015-07-31 DIAGNOSIS — K6289 Other specified diseases of anus and rectum: Secondary | ICD-10-CM

## 2015-07-31 DIAGNOSIS — R159 Full incontinence of feces: Secondary | ICD-10-CM | POA: Diagnosis not present

## 2015-07-31 DIAGNOSIS — M6289 Other specified disorders of muscle: Secondary | ICD-10-CM

## 2015-07-31 NOTE — Progress Notes (Signed)
   Subjective:    Patient ID: John [name], male    DOB: 1940/11/07, 74 y.o.   MRN: [id number]  HPI Here for advice about his chronic GI issues including IBS, GERD, fecal incontinence, and pelvic floor dysfunction. He had been seeing Dr. Carlean Purl until 2013, when was referred to the [facility]. In 2013 he had a colonoscopy there as well as anorectal manometry. He is now complaining of rectal pain, difficulty passing formed stools, and oocasional leakage of liquid stools.    Review of Systems  Constitutional: Negative.   Respiratory: Negative.   Cardiovascular: Negative.   Gastrointestinal: Positive for diarrhea and rectal pain. Negative for nausea, vomiting, abdominal pain, constipation, blood in stool, abdominal distention and anal bleeding.       Objective:   Physical Exam  Constitutional: He appears well-developed and well-nourished.  Cardiovascular: Normal rate, regular rhythm, normal heart sounds and intact distal pulses.   Pulmonary/Chest: Effort normal and breath sounds normal.  Abdominal: Soft. Bowel sounds are normal. He exhibits no distension and no mass. There is no tenderness. There is no rebound and no guarding.          Assessment & Plan:  Fecal incontinence and anorectal pain. Refer back to see Dr. Carlean Purl.

## 2015-07-31 NOTE — Progress Notes (Signed)
Pre visit review using our clinic review tool, if applicable. No additional management support is needed unless otherwise documented below in the visit note. 

## 2015-08-15 ENCOUNTER — Emergency Department (HOSPITAL_COMMUNITY): Payer: Medicare Other

## 2015-08-15 ENCOUNTER — Encounter (HOSPITAL_COMMUNITY): Payer: Self-pay | Admitting: Emergency Medicine

## 2015-08-15 ENCOUNTER — Inpatient Hospital Stay (HOSPITAL_COMMUNITY)
Admission: EM | Admit: 2015-08-15 | Discharge: 2015-08-17 | DRG: 841 | Disposition: A | Discharge status: Home / Self Care | Payer: Medicare Other | Attending: Internal Medicine | Admitting: Internal Medicine

## 2015-08-15 DIAGNOSIS — Z531 Procedure and treatment not carried out because of patient's decision for reasons of belief and group pressure: Secondary | ICD-10-CM | POA: Diagnosis present

## 2015-08-15 DIAGNOSIS — Z8673 Personal history of transient ischemic attack (TIA), and cerebral infarction without residual deficits: Secondary | ICD-10-CM

## 2015-08-15 DIAGNOSIS — I10 Essential (primary) hypertension: Secondary | ICD-10-CM | POA: Diagnosis present

## 2015-08-15 DIAGNOSIS — D72829 Elevated white blood cell count, unspecified: Secondary | ICD-10-CM | POA: Diagnosis not present

## 2015-08-15 DIAGNOSIS — Z8041 Family history of malignant neoplasm of ovary: Secondary | ICD-10-CM | POA: Diagnosis not present

## 2015-08-15 DIAGNOSIS — R161 Splenomegaly, not elsewhere classified: Secondary | ICD-10-CM | POA: Diagnosis present

## 2015-08-15 DIAGNOSIS — E860 Dehydration: Secondary | ICD-10-CM | POA: Diagnosis not present

## 2015-08-15 DIAGNOSIS — K529 Noninfective gastroenteritis and colitis, unspecified: Secondary | ICD-10-CM | POA: Diagnosis present

## 2015-08-15 DIAGNOSIS — N179 Acute kidney failure, unspecified: Secondary | ICD-10-CM | POA: Diagnosis not present

## 2015-08-15 DIAGNOSIS — D638 Anemia in other chronic diseases classified elsewhere: Secondary | ICD-10-CM | POA: Diagnosis present

## 2015-08-15 DIAGNOSIS — K5790 Diverticulosis of intestine, part unspecified, without perforation or abscess without bleeding: Secondary | ICD-10-CM | POA: Diagnosis present

## 2015-08-15 DIAGNOSIS — D471 Chronic myeloproliferative disease: Secondary | ICD-10-CM | POA: Diagnosis not present

## 2015-08-15 DIAGNOSIS — D473 Essential (hemorrhagic) thrombocythemia: Secondary | ICD-10-CM | POA: Diagnosis present

## 2015-08-15 DIAGNOSIS — K219 Gastro-esophageal reflux disease without esophagitis: Secondary | ICD-10-CM | POA: Diagnosis present

## 2015-08-15 DIAGNOSIS — R109 Unspecified abdominal pain: Secondary | ICD-10-CM | POA: Diagnosis present

## 2015-08-15 DIAGNOSIS — D45 Polycythemia vera: Secondary | ICD-10-CM | POA: Diagnosis not present

## 2015-08-15 DIAGNOSIS — Z8379 Family history of other diseases of the digestive system: Secondary | ICD-10-CM | POA: Diagnosis not present

## 2015-08-15 DIAGNOSIS — D649 Anemia, unspecified: Secondary | ICD-10-CM | POA: Diagnosis not present

## 2015-08-15 DIAGNOSIS — D72828 Other elevated white blood cell count: Secondary | ICD-10-CM | POA: Diagnosis present

## 2015-08-15 DIAGNOSIS — R1084 Generalized abdominal pain: Secondary | ICD-10-CM | POA: Diagnosis not present

## 2015-08-15 DIAGNOSIS — Z79899 Other long term (current) drug therapy: Secondary | ICD-10-CM

## 2015-08-15 DIAGNOSIS — F319 Bipolar disorder, unspecified: Secondary | ICD-10-CM | POA: Diagnosis present

## 2015-08-15 DIAGNOSIS — D7581 Myelofibrosis: Secondary | ICD-10-CM | POA: Diagnosis not present

## 2015-08-15 DIAGNOSIS — Z87891 Personal history of nicotine dependence: Secondary | ICD-10-CM

## 2015-08-15 DIAGNOSIS — R1012 Left upper quadrant pain: Secondary | ICD-10-CM | POA: Diagnosis present

## 2015-08-15 LAB — I-STAT CG4 LACTIC ACID, ED: LACTIC ACID, VENOUS: 0.35 mmol/L — AB (ref 0.5–2.0)

## 2015-08-15 LAB — COMPREHENSIVE METABOLIC PANEL
ALK PHOS: 59 U/L (ref 38–126)
ALT: 18 U/L (ref 17–63)
AST: 24 U/L (ref 15–41)
Albumin: 4.2 g/dL (ref 3.5–5.0)
Anion gap: 10 (ref 5–15)
BUN: 43 mg/dL — AB (ref 6–20)
CALCIUM: 9 mg/dL (ref 8.9–10.3)
CHLORIDE: 111 mmol/L (ref 101–111)
CO2: 20 mmol/L — AB (ref 22–32)
CREATININE: 1.63 mg/dL — AB (ref 0.61–1.24)
GFR calc Af Amer: 46 mL/min — ABNORMAL LOW (ref >60)
GFR calc non Af Amer: 40 mL/min — ABNORMAL LOW (ref >60)
GLUCOSE: 98 mg/dL (ref 65–99)
Potassium: 5.4 mmol/L — ABNORMAL HIGH (ref 3.5–5.1)
SODIUM: 141 mmol/L (ref 135–145)
Total Bilirubin: 0.1 mg/dL — ABNORMAL LOW (ref 0.3–1.2)
Total Protein: 8.4 g/dL — ABNORMAL HIGH (ref 6.5–8.1)

## 2015-08-15 LAB — DIFFERENTIAL
BASOS ABS: 0.8 10*3/uL — AB (ref 0.0–0.1)
BASOS PCT: 1 %
BLASTS: 0 %
Band Neutrophils: 17 %
Eosinophils Absolute: 0.8 10*3/uL — ABNORMAL HIGH (ref 0.0–0.7)
Eosinophils Relative: 1 %
LYMPHS PCT: 6 %
Lymphs Abs: 4.8 10*3/uL — ABNORMAL HIGH (ref 0.7–4.0)
Metamyelocytes Relative: 6 %
Monocytes Absolute: 4 10*3/uL — ABNORMAL HIGH (ref 0.1–1.0)
Monocytes Relative: 5 %
Myelocytes: 10 %
NEUTROS ABS: 69 10*3/uL — AB (ref 1.7–7.7)
Neutrophils Relative %: 52 %
Other: 0 %
PROMYELOCYTES ABS: 2 %
nRBC: 0 /100 WBC

## 2015-08-15 LAB — URINALYSIS, ROUTINE W REFLEX MICROSCOPIC
BILIRUBIN URINE: NEGATIVE
GLUCOSE, UA: NEGATIVE mg/dL
HGB URINE DIPSTICK: NEGATIVE
Ketones, ur: NEGATIVE mg/dL
Leukocytes, UA: NEGATIVE
Nitrite: NEGATIVE
PROTEIN: NEGATIVE mg/dL
Specific Gravity, Urine: 1.015 (ref 1.005–1.030)
pH: 6 (ref 5.0–8.0)

## 2015-08-15 LAB — CBC
HCT: 20 % — ABNORMAL LOW (ref 39.0–52.0)
Hemoglobin: 6.4 g/dL — CL (ref 13.0–17.0)
MCH: 27.7 pg (ref 26.0–34.0)
MCHC: 32 g/dL (ref 30.0–36.0)
MCV: 86.6 fL (ref 78.0–100.0)
PLATELETS: 436 10*3/uL — AB (ref 150–400)
RBC: 2.31 MIL/uL — AB (ref 4.22–5.81)
RDW: 25.4 % — AB (ref 11.5–15.5)
WBC: 79.4 10*3/uL (ref 4.0–10.5)

## 2015-08-15 LAB — LIPASE, BLOOD: LIPASE: 71 U/L — AB (ref 11–51)

## 2015-08-15 LAB — POC OCCULT BLOOD, ED: Fecal Occult Bld: NEGATIVE

## 2015-08-15 MED ORDER — IOHEXOL 300 MG/ML  SOLN
100.0000 mL | Freq: Once | INTRAMUSCULAR | Status: AC | PRN
  Administered 2015-08-15: 80 mL via INTRAVENOUS

## 2015-08-15 MED ORDER — SODIUM CHLORIDE 0.9 % IV BOLUS (SEPSIS)
1000.0000 mL | Freq: Once | INTRAVENOUS | Status: AC
  Administered 2015-08-15: 1000 mL via INTRAVENOUS

## 2015-08-15 MED ORDER — SODIUM CHLORIDE 0.9 % IV SOLN
INTRAVENOUS | Status: DC
Start: 2015-08-15 — End: 2015-08-17
  Administered 2015-08-15: 22:00:00 via INTRAVENOUS

## 2015-08-15 MED ORDER — IOHEXOL 300 MG/ML  SOLN
25.0000 mL | Freq: Once | INTRAMUSCULAR | Status: AC | PRN
  Administered 2015-08-15: 25 mL via ORAL

## 2015-08-15 MED ORDER — SODIUM CHLORIDE 0.9 % IV SOLN: Freq: Once | INTRAVENOUS | Status: DC

## 2015-08-15 NOTE — ED Notes (Signed)
Pt is aware a urine sample is needed. Urinal at bedside.  

## 2015-08-15 NOTE — ED Notes (Signed)
Pt states that he would rather not receive blood. MD made aware.

## 2015-08-15 NOTE — ED Notes (Signed)
GI doc sent him here for right and left lower quadrant pain with pain radiating into his right lower back. As well as complete loss of rectal control and having involuntary bowel movements. Pt states this has been worsening over the last couple years, but has gotten much worse over the last week. Having diarrhea and nausea, no fever, no back injury

## 2015-08-15 NOTE — ED Provider Notes (Signed)
CSN: [id number]     Arrival date & time 08/15/15  1851 History   First MD Initiated Contact with Patient 08/15/15 2100     Chief Complaint  Patient presents with  . Encopresis  . Abdominal Pain     (Consider location/radiation/quality/duration/timing/severity/associated sxs/prior Treatment) HPI Comments: Patient here complaining of lower abdominal pain 1 week with associated watery stools. Patient has had involuntary bowel movements and this is chronic in nature. He has a known anal sphincter dysfunction. Denies any blood in his stools. Has noted increased weakness that is worse with exertion. Denies any fever, vomiting does have some chills. Denies any urinary symptoms. He is not had chest pain but has been short of breath. Called his doctor was told to come in for further evaluation.  The history is provided by the patient.    Past Medical History  Diagnosis Date  . GERD (gastroesophageal reflux disease)   . HTN (hypertension)   . Diverticulitis     history of  . Diverticulosis of colon   . Reflex sympathetic dystrophy of the arm     left arm  . 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 IBS (irritable bowel syndrome)     see Dr. Carlean Purl  . Bipolar disorder [facility])     sees Dr. Tyrell Antonio in [place]  . Hemorrhoids, internal   . Hemorrhoids, external   . Gastritis   . Arthritis   . Depression   . Tubular adenoma 05/08/2009  . Anxiety states   . Pelvic floor dysfunction   . Colon polyps   . Thrombocythemia, essential ([place])     sees Dr. Valinda Party at [facility]    Past Surgical History  Procedure Laterality Date  . Appendectomy    . Left elbow surgery x 2      x 2  . Carpal tunnel release      left arm  . Spine surgery    . Tonsillectomy    . Colonoscopy  05/08/2009    adenoma, severe sigmid diverticulosis, int hemorrhoids, NL  random colon bxs and terminal ileum  . Esophagogastroduodenoscopy  10/04/2002    gastritis   Family History  Problem  Relation Age of Onset  . Ovarian cancer Mother   . Celiac disease Brother   . Alcohol abuse Father   . Colon cancer Neg Hx    Social History  Substance Use Topics  . Smoking status: Former Research scientist (life sciences)  . Smokeless tobacco: Never Used  . Alcohol Use: No    Review of Systems  All other systems reviewed and are negative.     Allergies  Latex  Home Medications   Prior to Admission medications   Medication Sig Start Date End Date Taking? Authorizing Provider  divalproex (DEPAKOTE SPRINKLE) 125 MG capsule Take 125 mg by mouth 4 (four) times daily.     Historical Provider, MD  Multiple Vitamins-Minerals (CENTRUM ADULTS PO) Take 1 tablet by mouth.    Historical Provider, MD  risperiDONE (RISPERDAL) 1 MG tablet Take 1 mg by mouth daily. Take once daily    Historical Provider, MD  sertraline (ZOLOFT) 100 MG tablet Take 150 mg by mouth every evening.     Historical Provider, MD   BP 117/73 mmHg  Pulse 92  Temp(Src) 97.6 F (36.4 C) (Oral)  Resp 18  SpO2 96% Physical Exam  Constitutional: He is oriented to person, place, and time. He appears well-developed and well-nourished.  Non-toxic appearance. No  distress.  HENT:  Head: Normocephalic and atraumatic.  Eyes: Conjunctivae, EOM and lids are normal. Pupils are equal, round, and reactive to light.  Neck: Normal range of motion. Neck supple. No tracheal deviation present. No thyroid mass present.  Cardiovascular: Normal rate, regular rhythm and normal heart sounds.  Exam reveals no gallop.   No murmur heard. Pulmonary/Chest: Effort normal and breath sounds normal. No stridor. No respiratory distress. He has no decreased breath sounds. He has no wheezes. He has no rhonchi. He has no rales.  Abdominal: Soft. Normal appearance and bowel sounds are normal. He exhibits no distension. There is tenderness in the left lower quadrant. There is no rebound and no CVA tenderness.    Musculoskeletal: Normal range of motion. He exhibits no edema or  tenderness.  Neurological: He is alert and oriented to person, place, and time. He has normal strength. No cranial nerve deficit or sensory deficit. GCS eye subscore is 4. GCS verbal subscore is 5. GCS motor subscore is 6.  Skin: Skin is warm and dry. No abrasion and no rash noted.  Psychiatric: He has a normal mood and affect. His speech is normal and behavior is normal.  Nursing note and vitals reviewed.   ED Course  Procedures (including critical care time) Labs Review Labs Reviewed  LIPASE, BLOOD - Abnormal; Notable for the following:    Lipase 71 (*)    All other components within normal limits  COMPREHENSIVE METABOLIC PANEL - Abnormal; Notable for the following:    Potassium 5.4 (*)    CO2 20 (*)    BUN 43 (*)    Creatinine, Ser 1.63 (*)    Total Protein 8.4 (*)    Total Bilirubin 0.1 (*)    GFR calc non Af Amer 40 (*)    GFR calc Af Amer 46 (*)    All other components within normal limits  CBC - Abnormal; Notable for the following:    WBC 79.4 (*)    RBC 2.31 (*)    Hemoglobin 6.4 (*)    HCT 20.0 (*)    RDW 25.4 (*)    Platelets 436 (*)    All other components within normal limits  URINALYSIS, ROUTINE W REFLEX MICROSCOPIC (NOT AT [facility])    Imaging Review No results found. I have personally reviewed and evaluated these images and lab results as part of my medical decision-making.   EKG Interpretation None      MDM   Final diagnoses:  None    Patient's lactate normal. Abdominal CT shows splenomegaly. Blood transfusion with packed red blood cells was ordered and patient has deferred. Discuss with hospitalist and patient will be admitted for further evaluation. Patient has been hemodynamically and medically stable here    Lacretia Leigh, MD 08/16/15 0002

## 2015-08-16 DIAGNOSIS — Z8673 Personal history of transient ischemic attack (TIA), and cerebral infarction without residual deficits: Secondary | ICD-10-CM | POA: Diagnosis not present

## 2015-08-16 DIAGNOSIS — K529 Noninfective gastroenteritis and colitis, unspecified: Secondary | ICD-10-CM | POA: Diagnosis not present

## 2015-08-16 DIAGNOSIS — E860 Dehydration: Secondary | ICD-10-CM | POA: Diagnosis present

## 2015-08-16 DIAGNOSIS — R1084 Generalized abdominal pain: Secondary | ICD-10-CM

## 2015-08-16 DIAGNOSIS — Z531 Procedure and treatment not carried out because of patient's decision for reasons of belief and group pressure: Secondary | ICD-10-CM | POA: Diagnosis present

## 2015-08-16 DIAGNOSIS — I10 Essential (primary) hypertension: Secondary | ICD-10-CM | POA: Diagnosis present

## 2015-08-16 DIAGNOSIS — D649 Anemia, unspecified: Secondary | ICD-10-CM | POA: Diagnosis not present

## 2015-08-16 DIAGNOSIS — R197 Diarrhea, unspecified: Secondary | ICD-10-CM

## 2015-08-16 DIAGNOSIS — N179 Acute kidney failure, unspecified: Secondary | ICD-10-CM | POA: Diagnosis present

## 2015-08-16 DIAGNOSIS — D72829 Elevated white blood cell count, unspecified: Secondary | ICD-10-CM | POA: Diagnosis not present

## 2015-08-16 DIAGNOSIS — Z8379 Family history of other diseases of the digestive system: Secondary | ICD-10-CM | POA: Diagnosis not present

## 2015-08-16 DIAGNOSIS — D471 Chronic myeloproliferative disease: Principal | ICD-10-CM

## 2015-08-16 DIAGNOSIS — R5383 Other fatigue: Secondary | ICD-10-CM

## 2015-08-16 DIAGNOSIS — D469 Myelodysplastic syndrome, unspecified: Secondary | ICD-10-CM | POA: Insufficient documentation

## 2015-08-16 DIAGNOSIS — K5790 Diverticulosis of intestine, part unspecified, without perforation or abscess without bleeding: Secondary | ICD-10-CM | POA: Diagnosis not present

## 2015-08-16 DIAGNOSIS — D72828 Other elevated white blood cell count: Secondary | ICD-10-CM | POA: Diagnosis present

## 2015-08-16 DIAGNOSIS — R161 Splenomegaly, not elsewhere classified: Secondary | ICD-10-CM | POA: Diagnosis not present

## 2015-08-16 DIAGNOSIS — Z79899 Other long term (current) drug therapy: Secondary | ICD-10-CM | POA: Diagnosis not present

## 2015-08-16 DIAGNOSIS — Z8041 Family history of malignant neoplasm of ovary: Secondary | ICD-10-CM | POA: Diagnosis not present

## 2015-08-16 DIAGNOSIS — K219 Gastro-esophageal reflux disease without esophagitis: Secondary | ICD-10-CM | POA: Diagnosis present

## 2015-08-16 DIAGNOSIS — D638 Anemia in other chronic diseases classified elsewhere: Secondary | ICD-10-CM | POA: Diagnosis not present

## 2015-08-16 DIAGNOSIS — D473 Essential (hemorrhagic) thrombocythemia: Secondary | ICD-10-CM

## 2015-08-16 DIAGNOSIS — R0600 Dyspnea, unspecified: Secondary | ICD-10-CM

## 2015-08-16 DIAGNOSIS — R1012 Left upper quadrant pain: Secondary | ICD-10-CM | POA: Diagnosis not present

## 2015-08-16 DIAGNOSIS — Z87891 Personal history of nicotine dependence: Secondary | ICD-10-CM | POA: Diagnosis not present

## 2015-08-16 DIAGNOSIS — F319 Bipolar disorder, unspecified: Secondary | ICD-10-CM | POA: Diagnosis present

## 2015-08-16 DIAGNOSIS — R109 Unspecified abdominal pain: Secondary | ICD-10-CM | POA: Diagnosis present

## 2015-08-16 DIAGNOSIS — D45 Polycythemia vera: Secondary | ICD-10-CM | POA: Diagnosis not present

## 2015-08-16 LAB — BASIC METABOLIC PANEL
Anion gap: 7 (ref 5–15)
BUN: 33 mg/dL — ABNORMAL HIGH (ref 6–20)
CALCIUM: 8.7 mg/dL — AB (ref 8.9–10.3)
CO2: 21 mmol/L — ABNORMAL LOW (ref 22–32)
CREATININE: 1.49 mg/dL — AB (ref 0.61–1.24)
Chloride: 111 mmol/L (ref 101–111)
GFR calc non Af Amer: 44 mL/min — ABNORMAL LOW (ref >60)
GFR, EST AFRICAN AMERICAN: 52 mL/min — AB (ref >60)
Glucose, Bld: 90 mg/dL (ref 65–99)
Potassium: 5.1 mmol/L (ref 3.5–5.1)
SODIUM: 139 mmol/L (ref 135–145)

## 2015-08-16 LAB — CBC
HCT: 20.5 % — ABNORMAL LOW (ref 39.0–52.0)
Hemoglobin: 6.3 g/dL — CL (ref 13.0–17.0)
MCH: 26.6 pg (ref 26.0–34.0)
MCHC: 30.7 g/dL (ref 30.0–36.0)
MCV: 86.5 fL (ref 78.0–100.0)
PLATELETS: 468 10*3/uL — AB (ref 150–400)
RBC: 2.37 MIL/uL — AB (ref 4.22–5.81)
RDW: 25.1 % — ABNORMAL HIGH (ref 11.5–15.5)
WBC: 78.4 10*3/uL — AB (ref 4.0–10.5)

## 2015-08-16 LAB — PATHOLOGIST SMEAR REVIEW

## 2015-08-16 LAB — ABO/RH: ABO/RH(D): A POS

## 2015-08-16 LAB — PREPARE RBC (CROSSMATCH)

## 2015-08-16 MED ORDER — HYDROMORPHONE HCL 1 MG/ML IJ SOLN: 0.5000 mg | INTRAMUSCULAR | Status: DC | PRN

## 2015-08-16 MED ORDER — ONDANSETRON HCL 4 MG/2ML IJ SOLN: 4.0000 mg | Freq: Four times a day (QID) | INTRAMUSCULAR | Status: DC | PRN

## 2015-08-16 MED ORDER — SERTRALINE HCL 50 MG PO TABS
150.0000 mg | ORAL_TABLET | Freq: Every evening | ORAL | Status: DC
  Administered 2015-08-16: 150 mg via ORAL
  Filled 2015-08-16 (×2): qty 1

## 2015-08-16 MED ORDER — ACETAMINOPHEN 325 MG PO TABS: 650.0000 mg | ORAL_TABLET | Freq: Four times a day (QID) | ORAL | Status: DC | PRN

## 2015-08-16 MED ORDER — ALUM & MAG HYDROXIDE-SIMETH 200-200-20 MG/5ML PO SUSP: 30.0000 mL | Freq: Four times a day (QID) | ORAL | Status: DC | PRN

## 2015-08-16 MED ORDER — SODIUM CHLORIDE 0.9 % IV SOLN
INTRAVENOUS | Status: DC
  Administered 2015-08-16 – 2015-08-17 (×2): via INTRAVENOUS

## 2015-08-16 MED ORDER — DIPHENHYDRAMINE HCL 25 MG PO CAPS
25.0000 mg | ORAL_CAPSULE | Freq: Four times a day (QID) | ORAL | Status: DC | PRN
  Administered 2015-08-16: 25 mg via ORAL
  Filled 2015-08-16: qty 1

## 2015-08-16 MED ORDER — DIVALPROEX SODIUM 125 MG PO CSDR
125.0000 mg | DELAYED_RELEASE_CAPSULE | Freq: Four times a day (QID) | ORAL | Status: DC
  Administered 2015-08-16 – 2015-08-17 (×5): 125 mg via ORAL
  Filled 2015-08-16 (×7): qty 1

## 2015-08-16 MED ORDER — ONDANSETRON HCL 4 MG PO TABS: 4.0000 mg | ORAL_TABLET | Freq: Four times a day (QID) | ORAL | Status: DC | PRN

## 2015-08-16 MED ORDER — RISPERIDONE 1 MG PO TABS
1.0000 mg | ORAL_TABLET | Freq: Every day | ORAL | Status: DC
  Administered 2015-08-16 – 2015-08-17 (×2): 1 mg via ORAL
  Filled 2015-08-16 (×2): qty 1

## 2015-08-16 MED ORDER — OXYCODONE HCL 5 MG PO TABS
5.0000 mg | ORAL_TABLET | ORAL | Status: DC | PRN
  Administered 2015-08-16: 5 mg via ORAL
  Filled 2015-08-16: qty 1

## 2015-08-16 MED ORDER — SODIUM CHLORIDE 0.9 % IJ SOLN: 3.0000 mL | Freq: Two times a day (BID) | INTRAMUSCULAR | Status: DC

## 2015-08-16 MED ORDER — ACETAMINOPHEN 650 MG RE SUPP: 650.0000 mg | Freq: Four times a day (QID) | RECTAL | Status: DC | PRN

## 2015-08-16 NOTE — Care Management Note (Signed)
Case Management Note  Patient Details  Name: John [name] MRN: [id number] Date of Birth: Oct 20, 1940  Subjective/Objective: 75 y/o m admitted w/myeloproliferative d/o. From home.                   Action/Plan:d/c plan home.   Expected Discharge Date:                  Expected Discharge Plan:  Home/Self Care  In-House Referral:     Discharge planning Services  CM Consult  Post Acute Care Choice:    Choice offered to:     DME Arranged:    DME Agency:     HH Arranged:    HH Agency:     Status of Service:  In process, will continue to follow  Medicare Important Message Given:    Date Medicare IM Given:    Medicare IM give by:    Date Additional Medicare IM Given:    Additional Medicare Important Message give by:     If discussed at [place] of Stay Meetings, dates discussed:    Additional Comments:  Dessa Phi, RN 08/16/2015, 2:53 PM

## 2015-08-16 NOTE — Progress Notes (Signed)
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 He was diagnosed at [facility] hematology oncology by Dr. Rudean Hitt and underwent a bone marrow biopsy on 03/11/2012  Prior Therapy: He was started on hydroxyurea and tolerated it poorly in 2013. He developed pancytopenia and required hospitalization back in October of 2013.  He has been off treatment since that time. He established care at the [facility] in 2014 but have not been seen here since April 2015. He has failed to show up for his subsequent appointments. He has been off treatment for his condition. His most recent CBC prior to that showed a hemoglobin of 8.5, white cell count of 15.5 and platelet count of 753.  He was hospitalized on 08/16/2015 after presenting with symptoms of diarrhea, eft upper quadrant pain and stool incontinence. He was found to have leukocytosis with white cell count of 79.4 and worsening anemia 6.4. CT scan obtained which showed splenomegaly and diverticulosis. He also possible diverticulitis.  Clinically, he reports worsening fatigue and tiredness. He also reports some dyspnea on exertion. He also have reported worsening diarrhea and incontinence of stool. Has not reported any hematochezia or melena. Has not reported any fevers or chills or sweats. His appetite have been poor. Does not report any chest pain palpitation orthopnea. Does not report any cough, wheezing or hemoptysis. Is that report any nausea, vomiting or hematochezia. He does not report any frequency or urgency.    Objective:  Vital signs in last 24 hours: Temp:  [97.6 F (36.4 C)] 97.6 F (36.4 C) (01/04 1943) Pulse Rate:  [65-120] 77 (01/05 0729) Resp:  [16-20] 16 (01/05 0729) BP: (93-129)/(53-87) 113/81 mmHg (01/05 0729) SpO2:  [90 %-97 %] 96 % (01/05 0729) Weight:  [164 lb 0.4 oz (74.4 kg)] 164 lb  0.4 oz (74.4 kg) (01/05 0748) Weight change:  Last BM Date: 08/16/15  Intake/Output from previous day:   Chronically ill-appearing gentleman without distress. Appeared pale Mouth: mucous membranes moist, pharynx normal without lesions Resp: clear to auscultation bilaterally Cardio: regular rate and rhythm, S1, S2 normal, no murmur, click, rub or gallop GI: soft, non-tender; bowel sounds normal; no masses,  no organomegaly Extremities: extremities normal, atraumatic, no cyanosis or edema    Lab Results:  Recent Labs  08/15/15 1955 08/16/15 0935  WBC 79.4* 78.4*  HGB 6.4* 6.3*  HCT 20.0* 20.5*  PLT 436* 468*    BMET  Recent Labs  08/15/15 1955 08/16/15 0935  NA 141 139  K 5.4* 5.1  CL 111 111  CO2 20* 21*  GLUCOSE 98 90  BUN 43* 33*  CREATININE 1.63* 1.49*  CALCIUM 9.0 8.7*   Peripheral smear was personally reviewed today and showed leukocytosis without any dysplasia or blasts. No evidence of immature white cells noted. There are fewer red cell fragments noted with tear drop-shaped cells.   Studies/Results: Ct Abdomen Pelvis W Contrast  08/15/2015  CLINICAL DATA:  Right and left lower quadrant pain. Pain radiates to the back. EXAM: CT ABDOMEN AND PELVIS WITH CONTRAST TECHNIQUE: Multidetector CT imaging of the abdomen and pelvis was performed using the standard protocol following bolus administration of intravenous contrast. CONTRAST:  79m OMNIPAQUE IOHEXOL 300 MG/ML SOLN, 891mOMNIPAQUE IOHEXOL 300 MG/ML SOLN COMPARISON:  CT 05/10/2004 FINDINGS: Lower chest: The included lung bases are clear. Coronary artery calcifications are seen. Liver: Prominent size measuring 19.9 cm craniocaudal dimension. Punctate hypodensity in  the right lobe, too small to characterize. Hepatobiliary: Gallbladder physiologically distended, no calcified stone. No biliary dilatation. Pancreas: No ductal dilatation or inflammation. Spleen: Massive splenomegaly. Spleen measures 18.1 x 17.6 x 9.0 cm  for a volume of 16 93 mL. No focal splenic lesion. Adrenal glands: No nodule. Kidneys: Symmetric renal enhancement. No hydronephrosis. No perinephric stranding. Stomach/Bowel: Stomach physiologically distended. There are no dilated or thickened small bowel loops. No bowel obstruction, oral contrast does reach the colon. Moderate volume of stool throughout the colon without colonic wall thickening. Diverticulosis of the distal colon without diverticulitis. The appendix is not visualized, surgically absent per report. Vascular/Lymphatic: No retroperitoneal adenopathy. Small central mesenteric lymph nodes, not enlarged by size criteria. No pelvic adenopathy. Abdominal aorta is normal in caliber. Moderate atherosclerosis and tortuosity without aneurysm. Reproductive: Prostate gland normal in size. Bladder: Physiologically distended, no wall thickening. Other: No free air, free fluid, or intra-abdominal fluid collection. Musculoskeletal: There are no acute or suspicious osseous abnormalities. Multilevel degenerative change in the spine. Hemi transitional lumbosacral anatomy incidentally noted. IMPRESSION: 1. Marked splenomegaly. Spleen measures 18.1 cm greatest dimension. No focal splenic lesion. 2. Borderline hepatic megaly. 3. Colonic diverticulosis without diverticulitis. Atherosclerosis, incidentally noted. Electronically Signed   By: Jeb Levering M.D.   On: 08/15/2015 23:36    Medications: I have reviewed the patient's current medications.  Assessment/Plan:  75 year old gentleman with the following issues:  1. Myeloproliferative disorder presenting with leukocytosis, thrombocytosis and worsening anemia. This is likely represents essential thrombocythemia or primary myelofibrosis with JAK 2 positive mutation. He has been treated with hydroxyurea in the past with poor tolerance and profound pancytopenia related to it.  There is very limited treatment options for this condition given the rapid  progression he is experiencing. There is no curative option and certainly he is not a candidate for stem cell transplant.  Any treatments moving forward would be merely palliative to relieve his pain from splenomegaly and correct his anemia. We can certainly consider Jakafi down the line as it might help with his splenic pain but will not offer her any definitive treatment for his condition.  I recommended supportive care only at this time and I see no evidence of leukemic transformation which is a possibility abdominal line. If he develops leukemia down the line, he will not be a candidate for any aggressive therapy.  I will arrange follow-up upon his discharge at the [facility].  2. Anemia: Could be related to his myeloproliferative disorder especially if we are dealing with a myelofibrosis-type picture. He declined red cell transfusion for religious reasons.  I will check iron level as well as erythropoietin level and depending on these levels he might benefit from erythropoietin replacement in the form of Aranesp. His last iron studies have been within normal range and would not use IV iron unless his iron stores are low. If his iron is low, using IV iron would be reasonable at that time.  3. Diarrhea and stool incontinence: I do not think this is related to his bone marrow disorder. Diverticulitis is a possibility as well as evaluation for C. difficile. Management as per the primary team.  My final recommendation will be given once his iron studies and erythropoietin level on her back at this time.   LOS: 0 days   SEGBTD,VVOHY 08/16/2015, 3:00 PM

## 2015-08-16 NOTE — H&P (Signed)
[facility] Admission History and Physical       John [name] [id number] DOB: 11-12-1940 DOA: 08/15/2015  Referring physician: EDP PCP: John Morale, MD  Specialists:   Chief Complaint: ABD and Flank Pain  HPI: John [name] is a 75 y.o. male with a history of Polycythemia Vera , HTN, and chronic Diarrhea who presents to the ED with complaints of worsening LUQ ABD Pain and Left Flank /Back Pain over the past week.   He denies any fever or chills, and denies nausea or vomiting.   He does report having SOB at times.   He was evaluated in the ED and was found to have a leukocytosis of 79.4, and a Hemoglobin of 6.4.   An FOBT was done and was HEME negative.  A CT scan of the ABD revealed Splenomegaly, and he was referred for admission and further workup.   Transfusion of blood was discussed and patient refused at this time.    Of Note: Patient reports being evaluated at [facility] due to abnormalities in his bloodwork 2 years ago.  The Medical records will be requested.     Review of Systems:  Constitutional: No Weight Loss, No Weight Gain, Night Sweats, Fevers, Chills, Dizziness, Light Headedness, Fatigue, or Generalized Weakness HEENT: No Headaches, Difficulty Swallowing,Tooth/Dental Problems,Sore Throat,  No Sneezing, Rhinitis, Ear Ache, Nasal Congestion, or Post Nasal Drip,  Cardio-vascular:  No Chest pain, Orthopnea, PND, Edema in Lower Extremities, Anasarca, Dizziness, Palpitations  Resp: No Dyspnea, No DOE, No Productive Cough, No Non-Productive Cough, No Hemoptysis, No Wheezing.    GI: No Heartburn, Indigestion, +Abdominal Pain, Nausea, Vomiting, Diarrhea, Constipation, Hematemesis, Hematochezia, Melena, Change in Bowel Habits,  Loss of Appetite  GU: No Dysuria, No Change in Color of Urine, No Urgency or Urinary Frequency, No Flank pain.  Musculoskeletal: No Joint Pain or Swelling, No Decreased Range of Motion, No Back Pain.  Neurologic:  No Syncope, No Seizures, Muscle Weakness, Paresthesia, Vision Disturbance or Loss, No Diplopia, No Vertigo, No Difficulty Walking,  Skin: No Rash or Lesions. Psych: No Change in Mood or Affect, No Depression or Anxiety, No Memory loss, No Confusion, or Hallucinations   Past Medical History  Diagnosis Date  . GERD (gastroesophageal reflux disease)   . HTN (hypertension)   . Diverticulitis     history of  . Diverticulosis of colon   . Reflex sympathetic dystrophy of the arm     left arm  . 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 IBS (irritable bowel syndrome)     see Dr. Carlean [name]  . Bipolar disorder [facility])     sees Dr. Tyrell [name] in [place]  . Hemorrhoids, internal   . Hemorrhoids, external   . Gastritis   . Arthritis   . Depression   . Tubular adenoma 05/08/2009  . Anxiety states   . Pelvic floor dysfunction   . Colon polyps   . Thrombocythemia, essential ([place])     sees Dr. Valinda [name] at [facility]      Past Surgical History  Procedure Laterality Date  . Appendectomy    . Left elbow surgery x 2      x 2  . Carpal tunnel release      left arm  . Spine surgery    . Tonsillectomy    . Colonoscopy  05/08/2009    adenoma, severe sigmid diverticulosis, int hemorrhoids, NL  random colon bxs and terminal ileum  .  Esophagogastroduodenoscopy  10/04/2002    gastritis      Prior to Admission medications   Medication Sig Start Date End Date Taking? Authorizing Provider  divalproex (DEPAKOTE SPRINKLE) 125 MG capsule Take 125 mg by mouth 4 (four) times daily.    Yes Historical Provider, MD  doxylamine, Sleep, (UNISOM) 25 MG tablet Take 25 mg by mouth at bedtime as needed for sleep.   Yes Historical Provider, MD  Multiple Vitamins-Minerals (CENTRUM ADULTS PO) Take 1 tablet by mouth.   Yes Historical Provider, MD  risperiDONE (RISPERDAL) 1 MG tablet Take 1 mg by mouth daily.    Yes Historical Provider, MD  sertraline (ZOLOFT) 100 MG tablet Take 150 mg by mouth  every evening.    Yes Historical Provider, MD     Allergies  Allergen Reactions  . Latex     REACTION: rash    Social History:  reports that he has quit smoking. He has never used smokeless tobacco. He reports that he does not drink alcohol or use illicit drugs.    Family History  Problem Relation Age of Onset  . Ovarian cancer Mother   . Celiac disease Brother   . Alcohol abuse Father   . Colon cancer Neg Hx        Physical Exam:  GEN:  Pleasant Obese Elderly  75 y.o. Caucasian male examined and in no acute distress; cooperative with exam Filed Vitals:   08/15/15 1943 08/15/15 2051 08/15/15 2208  BP: 120/87 117/73 110/66  Pulse: 105 92 99  Temp: 97.6 F (36.4 C)    TempSrc: Oral    Resp: 20 18 18   SpO2: 95% 96% 93%   Blood pressure 110/66, pulse 99, temperature 97.6 F (36.4 C), temperature source Oral, resp. rate 18, SpO2 93 %. PSYCH: He is alert and oriented x4; does not appear anxious does not appear depressed; affect is normal HEENT: Normocephalic and Atraumatic, Mucous membranes pink; PERRLA; EOM intact; Fundi:  Benign;  No scleral icterus, Nares: Patent, Oropharynx: Clear, Fair Dentition,    Neck:  FROM, No Cervical Lymphadenopathy nor Thyromegaly or Carotid Bruit; No JVD; Breasts:: Not examined CHEST WALL: No tenderness CHEST: Normal respiration, clear to auscultation bilaterally HEART: Regular rate and rhythm; no murmurs rubs or gallops BACK: No kyphosis or scoliosis; No CVA tenderness ABDOMEN: Positive Bowel Sounds,  Obese, Soft Non-Tender, No Rebound or Guarding; No Masses, +Organomegaly Rectal Exam: Not done EXTREMITIES: No Cyanosis, Clubbing, or Edema; No Ulcerations. Genitalia: not examined PULSES: 2+ and symmetric SKIN: Normal hydration no rash or ulceration CNS:  Alert and Oriented x 4, No Focal Deficits Vascular: pulses palpable throughout    Labs on Admission:  Basic Metabolic Panel:  Recent Labs Lab 08/15/15 1955  NA 141  K 5.4*  CL 111   CO2 20*  GLUCOSE 98  BUN 43*  CREATININE 1.63*  CALCIUM 9.0   Liver Function Tests:  Recent Labs Lab 08/15/15 1955  AST 24  ALT 18  ALKPHOS 59  BILITOT 0.1*  PROT 8.4*  ALBUMIN 4.2    Recent Labs Lab 08/15/15 1955  LIPASE 71*   No results for input(s): AMMONIA in the last 168 hours. CBC:  Recent Labs Lab 08/15/15 1955  WBC 79.4*  NEUTROABS 69.0*  HGB 6.4*  HCT 20.0*  MCV 86.6  PLT 436*   Cardiac Enzymes: No results for input(s): CKTOTAL, CKMB, CKMBINDEX, TROPONINI in the last 168 hours.  BNP (last 3 results) No results for input(s): BNP in the last 8760 hours.  ProBNP (last 3 results) No results for input(s): PROBNP in the last 8760 hours.  CBG: No results for input(s): GLUCAP in the last 168 hours.  Radiological Exams on Admission: Ct Abdomen Pelvis W Contrast  08/15/2015  CLINICAL DATA:  Right and left lower quadrant pain. Pain radiates to the back. EXAM: CT ABDOMEN AND PELVIS WITH CONTRAST TECHNIQUE: Multidetector CT imaging of the abdomen and pelvis was performed using the standard protocol following bolus administration of intravenous contrast. CONTRAST:  15mL OMNIPAQUE IOHEXOL 300 MG/ML SOLN, 88mL OMNIPAQUE IOHEXOL 300 MG/ML SOLN COMPARISON:  CT 05/10/2004 FINDINGS: Lower chest: The included lung bases are clear. Coronary artery calcifications are seen. Liver: Prominent size measuring 19.9 cm craniocaudal dimension. Punctate hypodensity in the right lobe, too small to characterize. Hepatobiliary: Gallbladder physiologically distended, no calcified stone. No biliary dilatation. Pancreas: No ductal dilatation or inflammation. Spleen: Massive splenomegaly. Spleen measures 18.1 x 17.6 x 9.0 cm for a volume of 16 93 mL. No focal splenic lesion. Adrenal glands: No nodule. Kidneys: Symmetric renal enhancement. No hydronephrosis. No perinephric stranding. Stomach/Bowel: Stomach physiologically distended. There are no dilated or thickened small bowel loops. No bowel  obstruction, oral contrast does reach the colon. Moderate volume of stool throughout the colon without colonic wall thickening. Diverticulosis of the distal colon without diverticulitis. The appendix is not visualized, surgically absent per report. Vascular/Lymphatic: No retroperitoneal adenopathy. Small central mesenteric lymph nodes, not enlarged by size criteria. No pelvic adenopathy. Abdominal aorta is normal in caliber. Moderate atherosclerosis and tortuosity without aneurysm. Reproductive: Prostate gland normal in size. Bladder: Physiologically distended, no wall thickening. Other: No free air, free fluid, or intra-abdominal fluid collection. Musculoskeletal: There are no acute or suspicious osseous abnormalities. Multilevel degenerative change in the spine. Hemi transitional lumbosacral anatomy incidentally noted. IMPRESSION: 1. Marked splenomegaly. Spleen measures 18.1 cm greatest dimension. No focal splenic lesion. 2. Borderline hepatic megaly. 3. Colonic diverticulosis without diverticulitis. Atherosclerosis, incidentally noted. Electronically Signed   By: Jeb Levering M.D.   On: 08/15/2015 23:36     EKG: Independently reviewed.         Assessment/Plan:      75 y.o. male with  Active Problems:   1.     Myeloproliferative disorder ([place])   Consult Heme/Onc in AM   Request records from [name] 2 years ago     2.     Anemia   Monitor trend   Send Anemia Panel   NO Transfusions at this time due to Patient's refusal     3.     Refusal of blood transfusions    Noted     4.     Polycythemia vera (HCC)   Monitor PLTs     5.     Splenomegaly   On CT scan     6.     Abdominal pain- due to #5    Pain Control   Hematology Consult in AM     7.     Chronic diarrhea   C.Diff PCR sent   In Past had been seen by GI Dr. Carlean [name] and was [facility] to [facility]     8.     DVT Prophylaxis   SCDs    Code Status:     FULL CODE        Family Communication:    No Family Present      Disposition Plan:    Inpatient  Status        Time spent:  54 Minutes  Theressa Millard [facility] Pager [phone]   If [place] Please Contact the Day Rounding Team MD for [facility]  If 7PM-7AM, Please Contact Night-Floor Coverage  [email or web address] Password [email or web address] 08/16/2015, 12:25 AM     ADDENDUM:   Patient was seen and examined on 08/16/2015

## 2015-08-16 NOTE — Progress Notes (Addendum)
[facility] PROGRESS NOTE  OSHA ERRICO VQQ:[id number] DOB: [birth year] DOA: 08/15/2015 PCP: Laurey Morale, MD  Brief narrative 75 year old male with history of myeloproliferative disorder with essential thymocytes anemia diagnosed with bone marrow biopsy in 03/2012. Patient was following with oncologist Dr. Rudean Hitt. He was started on hydroxyurea but tolerated poorly and developed pancytopenia and required hospitalization in October 2013. He was stable thereafter and was following with Dr. Alen Blew until 2015. He remained anemic with in the lumen of around 8.5 and white count and 15,000 and platelets in 700,000. Patient presented to the ED with diarrhea, left upper quadrant abdominal pain which has worsened for the past 1 week. He also reports persistent weakness for past several weeks. Blood work showed a CBC of 79.4K and hemoglobin of 6.4. FOBT was negative. A CT scan of the abdomen done showed massive splenomegaly and hospitalists admission requested.  Assessment/Plan: Abdominal pain with anemia, thrombocytosis and leukocytosis Has massive splenomegaly on CT scan. Possibly worsening of his myeloproliferative disorder. Given his poor tolerance to hydroxyurea in the past and progression of his disease his oncologist feels he has limited treatment options. -Iron panel and erythropoietin level ordered. -Patient refused transfusion stating he is against transfusion. (his wife's family are Jehovah's Witness and he is also getting convinced) -Oncology recommends supportive care with palliation for his pain -Further management once his iron panel and erythropoietin level comes back.  Diarrhea Possibly chronic although has worsened for the past 2 days.. CT shows diverticulosis without diverticulitis. I will check stool for C. Difficile.  Acute kidney injury Possibly due to dehydration. Monitor with IV fluids.  Diet: Regular DVT prophylaxis: SCDs  Code Status: Full code Family  Communication: Wife at bedside Disposition Plan: Inpatient   Consultants:  Dr. Hans Eden  Procedures:  CT abdomen  Antibiotics:  None  HPI/Subjective: Admission H&P reviewed. Reports left upper quadrant abdominal pain which is better. Has off-and-on diarrhea.  Objective: Filed Vitals:   08/16/15 0729 08/16/15 1509  BP: 113/81 99/73  Pulse: 77 85  Temp:  97.7 F (36.5 C)  Resp: 16 18    Intake/Output Summary (Last 24 hours) at 08/16/15 1557 Last data filed at 08/16/15 1500  Gross per 24 hour  Intake      0 ml  Output    525 ml  Net   -525 ml   Filed Weights   08/16/15 0748  Weight: 74.4 kg (164 lb 0.4 oz)    Exam:   General:  Elderly frail male appears fatigued, not in distress  HEENT: Pallor present, moist mucosa  Chest: Clear bilaterally  CVS: Normal S1 and S2, no murmurs  GI: Soft, nondistended, palpable spleen, bowel sounds present  Musculoskeletal: Warm, no edema, 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 CBC:  Recent Labs Lab 08/15/15 1955 08/16/15 0935  WBC 79.4* 78.4*  NEUTROABS 69.0*  --   HGB 6.4* 6.3*  HCT 20.0* 20.5*  MCV 86.6 86.5  PLT 436* 468*   Cardiac Enzymes: No results for input(s): CKTOTAL, CKMB, CKMBINDEX, TROPONINI in the last 168 hours. BNP (last 3 results) No results for input(s): BNP in the last 8760 hours.  ProBNP (last 3 results) No results for input(s): PROBNP in the last 8760 hours.  CBG: No results for input(s): GLUCAP in  the last 168 hours.  Recent Results (from the past 240 hour(s))  Culture, blood (Routine X 2) w Reflex to ID Panel     Status: None (Preliminary result)   Collection Time: 08/15/15  9:43 PM  Result Value Ref Range Status   Specimen Description BLOOD RIGHT ANTECUBITAL  Final   Special Requests BOTTLES DRAWN AEROBIC AND ANAEROBIC 5ML  Final   Culture   Final    NO GROWTH < 12 HOURS Performed at [facility]    Report Status PENDING  Incomplete  Culture, blood (Routine X 2) w Reflex to ID Panel     Status: None (Preliminary result)   Collection Time: 08/15/15  9:44 PM  Result Value Ref Range Status   Specimen Description BLOOD RIGHT ANTECUBITAL  Final   Special Requests BOTTLES DRAWN AEROBIC AND ANAEROBIC 5ML  Final   Culture   Final    NO GROWTH < 12 HOURS Performed at [facility]    Report Status PENDING  Incomplete     Studies: Ct Abdomen Pelvis W Contrast  08/15/2015  CLINICAL DATA:  Right and left lower quadrant pain. Pain radiates to the back. EXAM: CT ABDOMEN AND PELVIS WITH CONTRAST TECHNIQUE: Multidetector CT imaging of the abdomen and pelvis was performed using the standard protocol following bolus administration of intravenous contrast. CONTRAST:  2m OMNIPAQUE IOHEXOL 300 MG/ML SOLN, 865mOMNIPAQUE IOHEXOL 300 MG/ML SOLN COMPARISON:  CT 05/10/2004 FINDINGS: Lower chest: The included lung bases are clear. Coronary artery calcifications are seen. Liver: Prominent size measuring 19.9 cm craniocaudal dimension. Punctate hypodensity in the right lobe, too small to characterize. Hepatobiliary: Gallbladder physiologically distended, no calcified stone. No biliary dilatation. Pancreas: No ductal dilatation or inflammation. Spleen: Massive splenomegaly. Spleen measures 18.1 x 17.6 x 9.0 cm for a volume of 16 93 mL. No focal splenic lesion. Adrenal glands: No nodule. Kidneys: Symmetric renal enhancement. No hydronephrosis. No perinephric stranding. Stomach/Bowel: Stomach  physiologically distended. There are no dilated or thickened small bowel loops. No bowel obstruction, oral contrast does reach the colon. Moderate volume of stool throughout the colon without colonic wall thickening. Diverticulosis of the distal colon without diverticulitis. The appendix is not visualized, surgically absent per report. Vascular/Lymphatic: No retroperitoneal adenopathy. Small central mesenteric lymph nodes, not enlarged by size criteria. No pelvic adenopathy. Abdominal aorta is normal in caliber. Moderate atherosclerosis and tortuosity without aneurysm. Reproductive: Prostate gland normal in size. Bladder: Physiologically distended, no wall thickening. Other: No free air, free fluid, or intra-abdominal fluid collection. Musculoskeletal: There are no acute or suspicious osseous abnormalities. Multilevel degenerative change in the spine. Hemi transitional lumbosacral anatomy incidentally noted. IMPRESSION: 1. Marked splenomegaly. Spleen measures 18.1 cm greatest dimension. No focal splenic lesion. 2. Borderline hepatic megaly. 3. Colonic diverticulosis without diverticulitis. Atherosclerosis, incidentally noted. Electronically Signed   By: MeJeb Levering.D.   On: 08/15/2015 23:36    Scheduled Meds: . sodium chloride   Intravenous Once  . divalproex  125 mg Oral QID  . risperiDONE  1 mg Oral Daily  . sertraline  150 mg Oral QPM  . sodium chloride  3  mL Intravenous Q12H   Continuous Infusions: . sodium chloride 125 mL/hr at 08/15/15 2202  . sodium chloride 50 mL/hr at 08/16/15 0948      Time spent: 25 minutes    Louellen Molder  [facility] Pager [phone] If 7PM-7AM, please contact night-coverage at [email or web address], password [facility] 08/16/2015, 3:57 PM  LOS: 0 days

## 2015-08-17 ENCOUNTER — Other Ambulatory Visit: Payer: Self-pay | Admitting: Oncology

## 2015-08-17 DIAGNOSIS — Z531 Procedure and treatment not carried out because of patient's decision for reasons of belief and group pressure: Secondary | ICD-10-CM

## 2015-08-17 DIAGNOSIS — D45 Polycythemia vera: Secondary | ICD-10-CM

## 2015-08-17 DIAGNOSIS — D471 Chronic myeloproliferative disease: Secondary | ICD-10-CM

## 2015-08-17 DIAGNOSIS — R161 Splenomegaly, not elsewhere classified: Secondary | ICD-10-CM

## 2015-08-17 DIAGNOSIS — D638 Anemia in other chronic diseases classified elsewhere: Secondary | ICD-10-CM

## 2015-08-17 LAB — CBC
HEMATOCRIT: 20.3 % — AB (ref 39.0–52.0)
Hemoglobin: 6.5 g/dL — CL (ref 13.0–17.0)
MCH: 27.5 pg (ref 26.0–34.0)
MCHC: 32 g/dL (ref 30.0–36.0)
MCV: 86 fL (ref 78.0–100.0)
PLATELETS: 464 10*3/uL — AB (ref 150–400)
RBC: 2.36 MIL/uL — ABNORMAL LOW (ref 4.22–5.81)
RDW: 25 % — AB (ref 11.5–15.5)
WBC: 81.1 10*3/uL (ref 4.0–10.5)

## 2015-08-17 LAB — BASIC METABOLIC PANEL
ANION GAP: 8 (ref 5–15)
BUN: 28 mg/dL — ABNORMAL HIGH (ref 6–20)
CHLORIDE: 112 mmol/L — AB (ref 101–111)
CO2: 20 mmol/L — AB (ref 22–32)
Calcium: 9 mg/dL (ref 8.9–10.3)
Creatinine, Ser: 1.37 mg/dL — ABNORMAL HIGH (ref 0.61–1.24)
GFR calc Af Amer: 57 mL/min — ABNORMAL LOW (ref >60)
GFR calc non Af Amer: 49 mL/min — ABNORMAL LOW (ref >60)
Glucose, Bld: 93 mg/dL (ref 65–99)
POTASSIUM: 5.3 mmol/L — AB (ref 3.5–5.1)
Sodium: 140 mmol/L (ref 135–145)

## 2015-08-17 LAB — IRON AND TIBC
IRON: 51 ug/dL (ref 45–182)
SATURATION RATIOS: 17 % — AB (ref 17.9–39.5)
TIBC: 304 ug/dL (ref 250–450)
UIBC: 253 ug/dL

## 2015-08-17 LAB — FERRITIN: FERRITIN: 188 ng/mL (ref 24–336)

## 2015-08-17 MED ORDER — SODIUM POLYSTYRENE SULFONATE 15 GM/60ML PO SUSP
15.0000 g | Freq: Once | ORAL | Status: DC
  Filled 2015-08-17: qty 60

## 2015-08-17 MED ORDER — OXYCODONE HCL 5 MG PO TABS: 5.0000 mg | ORAL_TABLET | ORAL | Status: DC | PRN

## 2015-08-17 MED ORDER — SODIUM POLYSTYRENE SULFONATE 15 GM/60ML PO SUSP: 15.0000 g | Freq: Once | ORAL | Status: DC

## 2015-08-17 NOTE — Care Management Note (Signed)
Case Management Note  Patient Details  Name: John [name] MRN: [id number] Date of Birth: 1941-02-24  Subjective/Objective:OBS status. cc44 given-patient voiced understanding.                     Action/Plan:d/c home no needs or orders.   Expected Discharge Date:                  Expected Discharge Plan:  Home/Self Care  In-House Referral:     Discharge planning Services  CM Consult  Post Acute Care Choice:    Choice offered to:     DME Arranged:    DME Agency:     HH Arranged:    [place] Agency:     Status of Service:  Completed, signed off  Medicare Important Message Given:    Date Medicare IM Given:    Medicare IM give by:    Date Additional Medicare IM Given:    Additional Medicare Important Message give by:     If discussed at [place] of Stay Meetings, dates discussed:    Additional Comments:  Dessa Phi, RN 08/17/2015, 11:41 AM

## 2015-08-17 NOTE — Discharge Summary (Signed)
Physician Discharge Summary  John [name] NTI:[id number] DOB: [birth year] DOA: 08/15/2015  PCP: Laurey Morale, MD  Admit date: 08/15/2015 Discharge date: 08/17/2015  Time spent: 25 minutes  Recommendations for Outpatient Follow-up:  1. Discharge home. Dr Hazeline Junker office will call patient with follow-up appointment in 1-2 weeks.   Discharge Diagnoses:  Principal Problem:   Abdominal pain   Active Problems:   Refusal of blood transfusions as patient is Jehovah's Witness   Polycythemia vera ([place])   Myeloproliferative disorder ([place])   Chronic diarrhea   Anemia of chronic disease   Splenomegaly   Discharge Condition: Fair  Diet recommendation: Regular  Filed Weights   08/16/15 0748  Weight: 74.4 kg (164 lb 0.4 oz)    History of present illness:  Please refer to admission H&P for details, in brief, 75 year old male with history of myeloproliferative disorder with essential thymocytes anemia diagnosed with bone marrow biopsy in 03/2012. Patient was following with oncologist Dr. Rudean Hitt. He was started on hydroxyurea but tolerated poorly and developed pancytopenia and required hospitalization in October 2013. He was stable thereafter and was following with Dr. Alen Blew until 2015. He remained anemic with in the lumen of around 8.5 and white count and 15,000 and platelets in 700,000. Patient presented to the ED with diarrhea, left upper quadrant abdominal pain which has worsened for the past 1 week. He also reports persistent weakness for past several weeks. Blood work showed a CBC of 79.4K and hemoglobin of 6.4. FOBT was negative. A CT scan of the abdomen done showed massive splenomegaly and hospitalists admission requested.  Hospital Course:  Abdominal pain with anemia, thrombocytosis and leukocytosis Has massive splenomegaly on CT scan. Possibly worsening of his myeloproliferative disorder. Given his poor tolerance to hydroxyurea in the past and progression of his disease his  oncologist feels he has limited treatment options. -Iron panel shows anemia of chronic disease. Erythropoietin level ordered and results pending. -Patient refused transfusion stating he is against transfusion. (his wife's family are Jehovah's Witness and he is also getting convinced) -Oncology recommends supportive care with palliation for his pain. -Dr. Alen Blew will arrange outpatient follow-up and decide on Aranesp injection if erythropoietin is low. -I will discharge him on oxycodone as needed for his pain. he is hemodynamically stable.  Diarrhea Possibly chronic although has worsened for the past 2 days.. CT shows diverticulosis without diverticulitis. Improved this morning.  Acute kidney injury Possibly due to dehydration. Resolved with IV fluids     Code Status: Full code   Family Communication: Wife at bedside Disposition Plan: Inpatient   Consultants:  Dr. 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 Qty: 30 tablet, Refills: 0      CONTINUE these medications which  have NOT CHANGED   Details  divalproex (DEPAKOTE SPRINKLE) 125 MG capsule Take 125 mg by mouth 4 (four) times daily.     doxylamine, Sleep, (UNISOM) 25 MG tablet  Take 25 mg by mouth at bedtime as needed for sleep.    Multiple Vitamins-Minerals (CENTRUM ADULTS PO) Take 1 tablet by mouth.   Associated Diagnoses: Polycythemia vera(238.4); ET (essential thrombocythemia) (HCC)    risperiDONE (RISPERDAL) 1 MG tablet Take 1 mg by mouth daily.     sertraline (ZOLOFT) 100 MG tablet Take 150 mg by mouth every evening.        Allergies  Allergen Reactions  . Latex     REACTION: rash   Follow-up Information    Follow up with FRY,STEPHEN A, MD. Call in 1 week.   Specialty:  Family Medicine   Contact information:   [address] [place] [address] [phone]       Follow up with [facility], MD In 1 week.   Specialty:  Oncology   Why:  office will call   Contact information:   [address] [place] [address] [phone]        The results of significant diagnostics from this hospitalization (including imaging, microbiology, ancillary and laboratory) are listed below for reference.    Significant Diagnostic Studies: Ct Abdomen Pelvis W Contrast  08/15/2015  CLINICAL DATA:  Right and left lower quadrant pain. Pain radiates to the back. EXAM: CT ABDOMEN AND PELVIS WITH CONTRAST TECHNIQUE: Multidetector CT imaging of the abdomen and pelvis was performed using the standard protocol following bolus administration of intravenous contrast. CONTRAST:  16m OMNIPAQUE IOHEXOL 300 MG/ML SOLN, 836mOMNIPAQUE IOHEXOL 300 MG/ML SOLN COMPARISON:  CT 05/10/2004 FINDINGS: Lower chest: The included lung bases are clear. Coronary artery calcifications are seen. Liver: Prominent size measuring 19.9 cm craniocaudal dimension. Punctate hypodensity in the right lobe, too small to characterize. Hepatobiliary: Gallbladder physiologically distended, no calcified stone. No biliary dilatation. Pancreas: No ductal dilatation or inflammation. Spleen: Massive splenomegaly. Spleen measures 18.1 x 17.6 x 9.0 cm for a volume of 16 93 mL. No focal splenic lesion.  Adrenal glands: No nodule. Kidneys: Symmetric renal enhancement. No hydronephrosis. No perinephric stranding. Stomach/Bowel: Stomach physiologically distended. There are no dilated or thickened small bowel loops. No bowel obstruction, oral contrast does reach the colon. Moderate volume of stool throughout the colon without colonic wall thickening. Diverticulosis of the distal colon without diverticulitis. The appendix is not visualized, surgically absent per report. Vascular/Lymphatic: No retroperitoneal adenopathy. Small central mesenteric lymph nodes, not enlarged by size criteria. No pelvic adenopathy. Abdominal aorta is normal in caliber. Moderate atherosclerosis and tortuosity without aneurysm. Reproductive: Prostate gland normal in size. Bladder: Physiologically distended, no wall thickening. Other: No free air, free fluid, or intra-abdominal fluid collection. Musculoskeletal: There are no acute or suspicious osseous abnormalities. Multilevel degenerative change in the spine. Hemi transitional lumbosacral anatomy incidentally noted. IMPRESSION: 1. Marked splenomegaly. Spleen measures 18.1 cm greatest dimension. No focal splenic lesion. 2. Borderline hepatic megaly. 3. Colonic diverticulosis without diverticulitis. Atherosclerosis, incidentally noted. Electronically Signed   By: MeJeb Levering.D.   On: 08/15/2015 23:36    Microbiology: Recent Results (from the past 240 hour(s))  Culture, blood (Routine X 2) w Reflex to ID Panel     Status: None (Preliminary result)   Collection Time: 08/15/15  9:43 PM  Result Value Ref Range Status   Specimen Description BLOOD RIGHT ANTECUBITAL  Final   Special Requests BOTTLES DRAWN AEROBIC AND  ANAEROBIC 5ML  Final   Culture   Final    NO GROWTH < 12 HOURS Performed at [facility]    Report Status PENDING  Incomplete  Culture, blood (Routine X 2) w Reflex to ID Panel     Status: None (Preliminary result)   Collection Time: 08/15/15  9:44 PM  Result  Value Ref Range Status   Specimen Description BLOOD RIGHT ANTECUBITAL  Final   Special Requests BOTTLES DRAWN AEROBIC AND ANAEROBIC 5ML  Final   Culture   Final    NO GROWTH < 12 HOURS Performed at [facility]    Report Status PENDING  Incomplete     Labs: Basic Metabolic Panel:  Recent Labs Lab 08/15/15 1955 08/16/15 0935 08/17/15 0521  NA 141 139 140  K 5.4* 5.1 5.3*  CL 111 111 112*  CO2 20* 21* 20*  GLUCOSE 98 90 93  BUN 43* 33* 28*  CREATININE 1.63* 1.49* 1.37*  CALCIUM 9.0 8.7* 9.0   Liver Function Tests:  Recent Labs Lab 08/15/15 1955  AST 24  ALT 18  ALKPHOS 59  BILITOT 0.1*  PROT 8.4*  ALBUMIN 4.2    Recent Labs Lab 08/15/15 1955  LIPASE 71*   No results for input(s): AMMONIA in the last 168 hours. CBC:  Recent Labs Lab 08/15/15 1955 08/16/15 0935 08/17/15 0521  WBC 79.4* 78.4* 81.1*  NEUTROABS 69.0*  --   --   HGB 6.4* 6.3* 6.5*  HCT 20.0* 20.5* 20.3*  MCV 86.6 86.5 86.0  PLT 436* 468* 464*   Cardiac Enzymes: No results for input(s): CKTOTAL, CKMB, CKMBINDEX, TROPONINI in the last 168 hours. BNP: BNP (last 3 results) No results for input(s): BNP in the last 8760 hours.  ProBNP (last 3 results) No results for input(s): PROBNP in the last 8760 hours.  CBG: No results for input(s): GLUCAP in the last 168 hours.     Signed:  Louellen Molder MD   [facility] 08/17/2015, 10:26 AM

## 2015-08-17 NOTE — Progress Notes (Signed)
Labs from 08/17/2015 were reviewed. His hemoglobin appears to be stable around 6.5 and white cell count around 81,000. Platelet count of 464.  Iron studies were reviewed and appear to be adequate. Erythropoietin level still pending.  I continue to recommend supportive management as you are doing. We'll await the result of erythropoietin and start Aranesp if his erythropoietin is low.   I see no benefit from intravenous iron at this time.

## 2015-08-17 NOTE — Care Management Note (Signed)
Case Management Note  Patient Details  Name: John [name] MRN: [id number] Date of Birth: 1941-03-02  Subjective/Objective: Condition code 73 given per medicare.                   Action/Plan:   Expected Discharge Date:                  Expected Discharge Plan:  Home/Self Care  In-House Referral:     Discharge planning Services  CM Consult  Post Acute Care Choice:    Choice offered to:     DME Arranged:    DME Agency:     HH Arranged:    [place] Agency:     Status of Service:  Completed, signed off  Medicare Important Message Given:    Date Medicare IM Given:    Medicare IM give by:    Date Additional Medicare IM Given:    Additional Medicare Important Message give by:     If discussed at [place] of Stay Meetings, dates discussed:    Additional Comments:  Dessa Phi, RN 08/17/2015, 11:42 AM

## 2015-08-19 ENCOUNTER — Telehealth: Payer: Self-pay | Admitting: Oncology

## 2015-08-19 LAB — ERYTHROPOIETIN: ERYTHROPOIETIN: 36.1 m[IU]/mL — AB (ref 2.6–18.5)

## 2015-08-19 LAB — TYPE AND SCREEN
ABO/RH(D): A POS
Antibody Screen: NEGATIVE
UNIT DIVISION: 0
Unit division: 0

## 2015-08-19 NOTE — Telephone Encounter (Signed)
Called and left a message with 1/11 appointments °

## 2015-08-20 LAB — CULTURE, BLOOD (ROUTINE X 2)
CULTURE: NO GROWTH
Culture: NO GROWTH

## 2015-08-22 ENCOUNTER — Ambulatory Visit: Payer: Medicare Other | Admitting: Oncology

## 2015-08-22 ENCOUNTER — Other Ambulatory Visit: Payer: Medicare Other

## 2015-08-22 ENCOUNTER — Telehealth: Payer: Self-pay | Admitting: Oncology

## 2015-08-22 ENCOUNTER — Ambulatory Visit: Payer: Medicare Other

## 2015-08-22 NOTE — Telephone Encounter (Signed)
r/s from 1/11 to 1/13 per patient - patient has new date/time

## 2015-08-24 ENCOUNTER — Ambulatory Visit (HOSPITAL_BASED_OUTPATIENT_CLINIC_OR_DEPARTMENT_OTHER): Payer: Medicare Other

## 2015-08-24 ENCOUNTER — Other Ambulatory Visit (HOSPITAL_BASED_OUTPATIENT_CLINIC_OR_DEPARTMENT_OTHER): Payer: Medicare Other

## 2015-08-24 ENCOUNTER — Ambulatory Visit (HOSPITAL_BASED_OUTPATIENT_CLINIC_OR_DEPARTMENT_OTHER): Payer: Medicare Other | Admitting: Oncology

## 2015-08-24 ENCOUNTER — Telehealth: Payer: Self-pay | Admitting: Oncology

## 2015-08-24 VITALS — BP 101/60 | HR 99 | Temp 98.0°F | Resp 18 | Ht 70.0 in | Wt 167.5 lb

## 2015-08-24 DIAGNOSIS — D471 Chronic myeloproliferative disease: Secondary | ICD-10-CM

## 2015-08-24 DIAGNOSIS — D649 Anemia, unspecified: Secondary | ICD-10-CM

## 2015-08-24 DIAGNOSIS — D72829 Elevated white blood cell count, unspecified: Secondary | ICD-10-CM | POA: Diagnosis not present

## 2015-08-24 DIAGNOSIS — R161 Splenomegaly, not elsewhere classified: Secondary | ICD-10-CM

## 2015-08-24 DIAGNOSIS — D45 Polycythemia vera: Secondary | ICD-10-CM

## 2015-08-24 DIAGNOSIS — D469 Myelodysplastic syndrome, unspecified: Secondary | ICD-10-CM

## 2015-08-24 DIAGNOSIS — D638 Anemia in other chronic diseases classified elsewhere: Secondary | ICD-10-CM

## 2015-08-24 LAB — MANUAL DIFFERENTIAL
ALC: 5.8 10*3/uL — AB (ref 0.9–3.3)
ANC (CHCC manual diff): 61.2 10*3/uL — ABNORMAL HIGH (ref 1.5–6.5)
BAND NEUTROPHILS: 15 % — AB (ref 0–10)
BASOPHIL: 1 % (ref 0–2)
BLASTS: 1 % — AB (ref 0–0)
EOS: 1 % (ref 0–7)
LYMPH: 8 % — AB (ref 14–49)
MONO: 4 % (ref 0–14)
Metamyelocytes: 10 % — ABNORMAL HIGH (ref 0–0)
Myelocytes: 10 % — ABNORMAL HIGH (ref 0–0)
PLT EST: ADEQUATE
PROMYELO: 1 % — AB (ref 0–0)
SEG: 49 % (ref 38–77)
nRBC: 1 % — ABNORMAL HIGH (ref 0–0)

## 2015-08-24 LAB — CBC WITH DIFFERENTIAL/PLATELET
HCT: 20.9 % — ABNORMAL LOW (ref 38.4–49.9)
HEMOGLOBIN: 6.1 g/dL — AB (ref 13.0–17.1)
MCH: 25.4 pg — ABNORMAL LOW (ref 27.2–33.4)
MCHC: 29.5 g/dL — ABNORMAL LOW (ref 32.0–36.0)
MCV: 86.3 fL (ref 79.3–98.0)
Platelets: 429 10*3/uL — ABNORMAL HIGH (ref 140–400)
RBC: 2.42 10*6/uL — ABNORMAL LOW (ref 4.20–5.82)
RDW: 22.3 % — AB (ref 11.0–14.6)
WBC: 72.9 10*3/uL — AB (ref 4.0–10.3)

## 2015-08-24 MED ORDER — DARBEPOETIN ALFA 300 MCG/0.6ML IJ SOSY
300.0000 ug | PREFILLED_SYRINGE | Freq: Once | INTRAMUSCULAR | Status: AC
  Administered 2015-08-24: 300 ug via SUBCUTANEOUS
  Filled 2015-08-24: qty 0.6

## 2015-08-24 NOTE — Telephone Encounter (Signed)
Gave avs report and appointments for Feb/March

## 2015-08-24 NOTE — Progress Notes (Signed)
Hematology and Oncology Follow Up Visit  John [name] [id number] 1940/11/21 75 y.o. 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 He was diagnosed at [facility] hematology oncology by Dr. Rudean Hitt and underwent a bone marrow biopsy on 03/11/2012  Prior Therapy: He was started on hydroxyurea and tolerated it poorly in 2013. He developed pancytopenia and required hospitalization back in October of 2013   Current therapy: Observation and follow up.   Interim History: John [name] presents today for a followup visit. He is a pleasant gentleman who returns today for a follow-up after he failed to follow up in the last year and a half. He was hospitalized recently for abdominal pain and found to have worsening anemia and leukocytosis indicating progressive myeloproliferative disorder. During his hospitalization, he was found to have a hemoglobin close to 6 but refused transfusions for religious reasons. His white cell increased up to 81,000. He was discharged from the hospital last week and have been week but improving slowly. He is able to ambulate without any major difficulty although reports some occasional lightheadedness.  He has not reported any headaches, blurry vision, syncope or seizures. He does not report any fevers, chills, sweats or weight loss. He does not report any chest pain, palpitation orthopnea. Does not report any cough, wheezing or hemoptysis. Does not report a nausea, vomiting is report some lower abdomen discomfort. Does not report any frequency urgency or hesitancy. Remaining review of systems unremarkable.  Medications: I have reviewed the patient's current medications.  Current Outpatient Prescriptions  Medication Sig Dispense Refill  . divalproex (DEPAKOTE SPRINKLE) 125 MG  capsule Take 125 mg by mouth 4 (four) times daily.     Marland Kitchen doxylamine, Sleep, (UNISOM) 25 MG tablet Take 25 mg by mouth at bedtime as needed for sleep.    . Multiple Vitamins-Minerals (CENTRUM ADULTS PO) Take 1 tablet by mouth.    . oxyCODONE (OXY IR/ROXICODONE) 5 MG immediate release tablet Take 1 tablet (5 mg total) by mouth every 4 (four) hours as needed for moderate pain. 30 tablet 0  . risperiDONE (RISPERDAL) 1 MG tablet Take 1 mg by mouth daily.     . sertraline (ZOLOFT) 100 MG tablet Take 150 mg by mouth every evening.      No current facility-administered medications for this visit.     Allergies:  Allergies  Allergen Reactions  . Latex     REACTION: rash    Past Medical History, Surgical history, Social history, and Family History were reviewed and updated.  Physical Exam: Blood pressure 101/60, pulse 99, temperature 98 F (36.7 C), temperature source Oral, resp. rate 18, height '5\' 10"'$  (1.778 m), weight 167 lb 8 oz (75.978 kg), SpO2 100 %. ECOG: 1 General appearance: alert, awake gentleman chronically ill-appearing without distress. Head: Normocephalic, without obvious abnormality Neck: no adenopathy Lymph nodes: Cervical, supraclavicular, and axillary nodes normal. Heart:regular rate and rhythm, S1, S2 normal, no murmur, click, rub or gallop Lung:chest clear, no wheezing, rales, normal symmetric air entry Abdomin: soft, non-tender, without masses or organomegaly. Spleen palpated. EXT:no erythema, induration, or nodules   Lab Results: Lab Results  Component Value Date   WBC 72.9* 08/24/2015   HGB 6.1* 08/24/2015   HCT 20.9* 08/24/2015   MCV 86.3 08/24/2015   PLT 429* 08/24/2015     Chemistry      Component Value Date/Time  NA 140 08/17/2015 0521   NA 140 11/18/2013 1421   K 5.3* 08/17/2015 0521   K 4.6 11/18/2013 1421   CL 112* 08/17/2015 0521   CO2 20* 08/17/2015 0521   CO2 21* 11/18/2013 1421   BUN 28* 08/17/2015 0521   BUN 21.8 11/18/2013 1421    CREATININE 1.37* 08/17/2015 0521   CREATININE 1.4* 11/18/2013 1421      Component Value Date/Time   CALCIUM 9.0 08/17/2015 0521   CALCIUM 9.1 11/18/2013 1421   ALKPHOS 59 08/15/2015 1955   ALKPHOS 41 11/18/2013 1421   AST 24 08/15/2015 1955   AST 24 11/18/2013 1421   ALT 18 08/15/2015 1955   ALT 8 11/18/2013 1421   BILITOT 0.1* 08/15/2015 1955   BILITOT 0.29 11/18/2013 1421      Impression and Plan:  75 year old gentleman with the following issues:  1. 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 He has JAK 2 positive mutation diagnosed in 2013. He was on hydroxyurea briefly but tolerated it poorly. He appears to have progressive disease with splenomegaly, leukocytosis and anemia.  He is not a candidate for any aggressive therapy at this time and I recommended supportive care only. Cytoreduction would be difficult given his profound anemia and his poor tolerance to hydroxyurea in the past.  He does not report any constitutional symptoms of fevers, chills or abdominal discomfort related to splenomegaly. If these symptoms occur Jakafi could be used to palliate his symptoms.  2. Anemia: His hemoglobin is around 6 and symptomatic. He refused transfusion for religious reasons. His erythropoietin level is  around 36 on 08/17/2015. Although elevated but not appropriately and likely would benefit from Aranesp.  Risks and benefits of this treatment was discussed today and is agreeable to proceed. This will be given at 300 g every 3 weeks to keep his hemoglobin above 10.  3. Prognosis: Relatively poor given his other comorbid conditions and the progressive nature of this disease.  4. Follow-up: Will be in 9 weeks to assess response.  Zola Button, MD 1/13/20173:51 PM

## 2015-09-14 ENCOUNTER — Other Ambulatory Visit: Payer: Self-pay | Admitting: Oncology

## 2015-09-14 ENCOUNTER — Other Ambulatory Visit (HOSPITAL_BASED_OUTPATIENT_CLINIC_OR_DEPARTMENT_OTHER): Payer: Medicare Other

## 2015-09-14 ENCOUNTER — Ambulatory Visit (HOSPITAL_BASED_OUTPATIENT_CLINIC_OR_DEPARTMENT_OTHER): Payer: Medicare Other

## 2015-09-14 VITALS — BP 109/57 | HR 76 | Temp 97.7°F | Resp 18

## 2015-09-14 DIAGNOSIS — D45 Polycythemia vera: Secondary | ICD-10-CM

## 2015-09-14 DIAGNOSIS — D649 Anemia, unspecified: Secondary | ICD-10-CM | POA: Diagnosis not present

## 2015-09-14 DIAGNOSIS — D469 Myelodysplastic syndrome, unspecified: Secondary | ICD-10-CM

## 2015-09-14 DIAGNOSIS — D471 Chronic myeloproliferative disease: Secondary | ICD-10-CM

## 2015-09-14 DIAGNOSIS — D638 Anemia in other chronic diseases classified elsewhere: Secondary | ICD-10-CM

## 2015-09-14 LAB — MANUAL DIFFERENTIAL
ALC: 2.8 10*3/uL (ref 0.9–3.3)
ANC (CHCC manual diff): 83.8 10*3/uL — ABNORMAL HIGH (ref 1.5–6.5)
BASOPHIL: 2 % (ref 0–2)
BLASTS: 1 % — AB (ref 0–0)
Band Neutrophils: 15 % — ABNORMAL HIGH (ref 0–10)
EOS%: 1 % (ref 0–7)
LYMPH: 3 % — ABNORMAL LOW (ref 14–49)
METAMYELOCYTES PCT: 12 % — AB (ref 0–0)
MONO: 3 % (ref 0–14)
Myelocytes: 10 % — ABNORMAL HIGH (ref 0–0)
PLT EST: ADEQUATE
PROMYELO: 1 % — AB (ref 0–0)
SEG: 52 % (ref 38–77)

## 2015-09-14 LAB — CBC WITH DIFFERENTIAL/PLATELET
HCT: 23.3 % — ABNORMAL LOW (ref 38.4–49.9)
HGB: 6.8 g/dL — CL (ref 13.0–17.1)
MCH: 24.8 pg — AB (ref 27.2–33.4)
MCHC: 29 g/dL — ABNORMAL LOW (ref 32.0–36.0)
MCV: 85.6 fL (ref 79.3–98.0)
PLATELETS: 337 10*3/uL (ref 140–400)
RBC: 2.73 10*6/uL — ABNORMAL LOW (ref 4.20–5.82)
RDW: 24.1 % — AB (ref 11.0–14.6)
WBC: 94.2 10*3/uL — AB (ref 4.0–10.3)

## 2015-09-14 MED ORDER — DARBEPOETIN ALFA 300 MCG/0.6ML IJ SOSY
300.0000 ug | PREFILLED_SYRINGE | Freq: Once | INTRAMUSCULAR | Status: AC
  Administered 2015-09-14: 300 ug via SUBCUTANEOUS
  Filled 2015-09-14: qty 0.6

## 2015-10-05 ENCOUNTER — Other Ambulatory Visit (HOSPITAL_BASED_OUTPATIENT_CLINIC_OR_DEPARTMENT_OTHER): Payer: Medicare Other

## 2015-10-05 ENCOUNTER — Ambulatory Visit (HOSPITAL_BASED_OUTPATIENT_CLINIC_OR_DEPARTMENT_OTHER): Payer: Medicare Other

## 2015-10-05 VITALS — BP 100/60 | HR 86 | Temp 98.5°F | Resp 24

## 2015-10-05 DIAGNOSIS — D638 Anemia in other chronic diseases classified elsewhere: Secondary | ICD-10-CM

## 2015-10-05 DIAGNOSIS — D649 Anemia, unspecified: Secondary | ICD-10-CM | POA: Diagnosis not present

## 2015-10-05 DIAGNOSIS — D471 Chronic myeloproliferative disease: Secondary | ICD-10-CM | POA: Diagnosis not present

## 2015-10-05 DIAGNOSIS — D45 Polycythemia vera: Secondary | ICD-10-CM

## 2015-10-05 DIAGNOSIS — D469 Myelodysplastic syndrome, unspecified: Secondary | ICD-10-CM

## 2015-10-05 LAB — CBC WITH DIFFERENTIAL/PLATELET
HEMATOCRIT: 22.3 % — AB (ref 38.4–49.9)
HEMOGLOBIN: 6.6 g/dL — AB (ref 13.0–17.1)
MCH: 25.5 pg — AB (ref 27.2–33.4)
MCHC: 29.6 g/dL — ABNORMAL LOW (ref 32.0–36.0)
MCV: 86 fL (ref 79.3–98.0)
Platelets: 518 10*3/uL — ABNORMAL HIGH (ref 140–400)
RBC: 2.59 10*6/uL — ABNORMAL LOW (ref 4.20–5.82)
RDW: 24.5 % — AB (ref 11.0–14.6)
WBC: 73.6 10*3/uL — AB (ref 4.0–10.3)

## 2015-10-05 LAB — MANUAL DIFFERENTIAL
ALC: 2.2 10*3/uL (ref 0.9–3.3)
ANC (CHCC MAN DIFF): 64.8 10*3/uL — AB (ref 1.5–6.5)
BLASTS: 1 % — AB (ref 0–0)
Band Neutrophils: 13 % — ABNORMAL HIGH (ref 0–10)
Basophil: 4 % — ABNORMAL HIGH (ref 0–2)
EOS%: 1 % (ref 0–7)
LYMPH: 3 % — AB (ref 14–49)
MONO: 2 % (ref 0–14)
Metamyelocytes: 12 % — ABNORMAL HIGH (ref 0–0)
Myelocytes: 10 % — ABNORMAL HIGH (ref 0–0)
NRBC: 2 % — AB (ref 0–0)
PLT EST: INCREASED
PROMYELO: 2 % — AB (ref 0–0)
SEG: 53 % (ref 38–77)

## 2015-10-05 MED ORDER — DARBEPOETIN ALFA 300 MCG/0.6ML IJ SOSY
300.0000 ug | PREFILLED_SYRINGE | Freq: Once | INTRAMUSCULAR | Status: AC
  Administered 2015-10-05: 300 ug via SUBCUTANEOUS
  Filled 2015-10-05: qty 0.6

## 2015-10-26 ENCOUNTER — Ambulatory Visit (HOSPITAL_BASED_OUTPATIENT_CLINIC_OR_DEPARTMENT_OTHER): Payer: Medicare Other | Admitting: Oncology

## 2015-10-26 ENCOUNTER — Ambulatory Visit (HOSPITAL_BASED_OUTPATIENT_CLINIC_OR_DEPARTMENT_OTHER): Payer: Medicare Other

## 2015-10-26 ENCOUNTER — Telehealth: Payer: Self-pay | Admitting: Oncology

## 2015-10-26 ENCOUNTER — Other Ambulatory Visit (HOSPITAL_BASED_OUTPATIENT_CLINIC_OR_DEPARTMENT_OTHER): Payer: Medicare Other

## 2015-10-26 VITALS — BP 102/64 | HR 92 | Temp 98.4°F | Resp 18 | Ht 70.0 in | Wt 169.3 lb

## 2015-10-26 DIAGNOSIS — D631 Anemia in chronic kidney disease: Secondary | ICD-10-CM | POA: Diagnosis not present

## 2015-10-26 DIAGNOSIS — D471 Chronic myeloproliferative disease: Secondary | ICD-10-CM

## 2015-10-26 DIAGNOSIS — N189 Chronic kidney disease, unspecified: Secondary | ICD-10-CM

## 2015-10-26 DIAGNOSIS — D469 Myelodysplastic syndrome, unspecified: Secondary | ICD-10-CM

## 2015-10-26 DIAGNOSIS — D45 Polycythemia vera: Secondary | ICD-10-CM

## 2015-10-26 DIAGNOSIS — D638 Anemia in other chronic diseases classified elsewhere: Secondary | ICD-10-CM

## 2015-10-26 LAB — CBC WITH DIFFERENTIAL/PLATELET
BASO%: 1.1 % (ref 0.0–2.0)
BASOS ABS: 0.8 10*3/uL — AB (ref 0.0–0.1)
EOS%: 0.2 % (ref 0.0–7.0)
Eosinophils Absolute: 0.2 10*3/uL (ref 0.0–0.5)
HEMATOCRIT: 23.8 % — AB (ref 38.4–49.9)
HEMOGLOBIN: 7.2 g/dL — AB (ref 13.0–17.1)
LYMPH#: 3.5 10*3/uL — AB (ref 0.9–3.3)
LYMPH%: 4.7 % — ABNORMAL LOW (ref 14.0–49.0)
MCH: 25.7 pg — AB (ref 27.2–33.4)
MCHC: 30.1 g/dL — ABNORMAL LOW (ref 32.0–36.0)
MCV: 85.3 fL (ref 79.3–98.0)
MONO#: 3.9 10*3/uL — AB (ref 0.1–0.9)
MONO%: 5.2 % (ref 0.0–14.0)
NEUT#: 66.5 10*3/uL — ABNORMAL HIGH (ref 1.5–6.5)
NEUT%: 88.8 % — ABNORMAL HIGH (ref 39.0–75.0)
PLATELETS: 279 10*3/uL (ref 140–400)
RBC: 2.79 10*6/uL — ABNORMAL LOW (ref 4.20–5.82)
RDW: 23.4 % — AB (ref 11.0–14.6)
WBC: 74.9 10*3/uL (ref 4.0–10.3)

## 2015-10-26 LAB — TECHNOLOGIST REVIEW: Technologist Review: 2

## 2015-10-26 MED ORDER — DARBEPOETIN ALFA 300 MCG/0.6ML IJ SOSY
300.0000 ug | PREFILLED_SYRINGE | Freq: Once | INTRAMUSCULAR | Status: AC
  Administered 2015-10-26: 300 ug via SUBCUTANEOUS
  Filled 2015-10-26: qty 0.6

## 2015-10-26 NOTE — Telephone Encounter (Signed)
Pt confirmed labs/ov/inj per 03/17 POF, gave pt AVS and Calendar... KJ

## 2015-10-26 NOTE — Addendum Note (Signed)
Addended by: Randolm Idol on: 10/26/2015 12:27 PM   Modules accepted: Medications

## 2015-10-26 NOTE — Progress Notes (Signed)
Hematology and Oncology Follow Up Visit  John [name] [id number] May 29, 1941 75 y.o. 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 He was diagnosed at [facility] hematology oncology by Dr. Rudean Hitt and underwent a bone marrow biopsy on 03/11/2012  Prior Therapy: He was started on hydroxyurea and tolerated it poorly in 2013. He developed pancytopenia and required hospitalization back in October of 2013   Current therapy: Aranesp 300 g every 3 weeks to get his hemoglobin to 10.  Interim History: Mr. Reaume presents today for a followup visit. Since the last visit, he has been receiving Aranesp with slight improvement in his overall health. He reports his appetite is better and have gained more weight. His activity level has improved and his mobility have been stable. He does not report any falls or syncope and does not require the use of a cane.   He does not report any complications associated with Aranesp. He denied any injection-related complications or hypertension. He does report occasional pruritus which is manageable. He denied any other constitutional symptoms or abdominal pain.  He has not reported any headaches, blurry vision, syncope or seizures. He does not report any fevers, chills, sweats or weight loss. He does not report any chest pain, palpitation orthopnea. Does not report any cough, wheezing or hemoptysis. Does not report a nausea, vomiting is report some lower abdomen discomfort. Does not report any frequency urgency or hesitancy. Remaining review of systems unremarkable.  Medications: I have reviewed the patient's current medications.  Current Outpatient Prescriptions  Medication Sig Dispense Refill  . divalproex (DEPAKOTE SPRINKLE) 125 MG capsule Take 125 mg by mouth 4  (four) times daily.     Marland Kitchen doxylamine, Sleep, (UNISOM) 25 MG tablet Take 25 mg by mouth at bedtime as needed for sleep.    . Multiple Vitamins-Minerals (CENTRUM ADULTS PO) Take 1 tablet by mouth.    . oxyCODONE (OXY IR/ROXICODONE) 5 MG immediate release tablet Take 1 tablet (5 mg total) by mouth every 4 (four) hours as needed for moderate pain. 30 tablet 0  . risperiDONE (RISPERDAL) 1 MG tablet Take 1 mg by mouth daily.     . sertraline (ZOLOFT) 100 MG tablet Take 150 mg by mouth every evening.      No current facility-administered medications for this visit.     Allergies:  Allergies  Allergen Reactions  . Latex     REACTION: rash    Past Medical History, Surgical history, Social history, and Family History were reviewed and updated.  Physical Exam: Blood pressure 102/64, pulse 92, temperature 98.4 F (36.9 C), temperature source Oral, resp. rate 18, height '5\' 10"'$  (3.762 m), weight 169 lb 4.8 oz (76.794 kg), SpO2 98 %. ECOG: 1 General appearance: alert, awake gentleman Appeared without distress. Head: Normocephalic, without obvious abnormality without oral ulcers or lesions. Neck: no adenopathy Lymph nodes: Cervical, supraclavicular, and axillary nodes normal. Heart:regular rate and rhythm, S1, S2 normal, no murmur, click, rub or gallop Lung:chest clear, no wheezing, rales, normal symmetric air entry Abdomin: soft, non-tender, without masses or organomegaly. Spleen palpated 3 cm under the rib cage. EXT:no erythema, induration, or nodules   Lab Results: Lab Results  Component Value Date   WBC 73.6* 10/05/2015   HGB 6.6* 10/05/2015   HCT 22.3* 10/05/2015   MCV 86.0 10/05/2015   PLT 518* 10/05/2015  Chemistry      Component Value Date/Time   NA 140 08/17/2015 0521   NA 140 11/18/2013 1421   K 5.3* 08/17/2015 0521   K 4.6 11/18/2013 1421   CL 112* 08/17/2015 0521   CO2 20* 08/17/2015 0521   CO2 21* 11/18/2013 1421   BUN 28* 08/17/2015 0521   BUN 21.8 11/18/2013  1421   CREATININE 1.37* 08/17/2015 0521   CREATININE 1.4* 11/18/2013 1421      Component Value Date/Time   CALCIUM 9.0 08/17/2015 0521   CALCIUM 9.1 11/18/2013 1421   ALKPHOS 59 08/15/2015 1955   ALKPHOS 41 11/18/2013 1421   AST 24 08/15/2015 1955   AST 24 11/18/2013 1421   ALT 18 08/15/2015 1955   ALT 8 11/18/2013 1421   BILITOT 0.1* 08/15/2015 1955   BILITOT 0.29 11/18/2013 1421      Impression and Plan:  75 year old gentleman with the following issues:  1. 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 He has JAK 2 positive mutation diagnosed in 2013. He was on hydroxyurea briefly but tolerated it poorly. He appears to have progressive disease with splenomegaly, leukocytosis and anemia.  He is not a candidate for any aggressive therapy at this time and I recommended supportive care only. Cytoreduction would be difficult given his profound anemia and his poor tolerance to hydroxyurea in the past.   2. Anemia: He has anemia of renal disease as well as. His hemoglobin Has improved with growth factor support in the form of Aranesp. I will recheck his iron studies to ensure adequacy in April 2017.  Risks and benefits of into doing this treatment was discussed and he is agreeable to continue.  3. Prognosis: Relatively poor given his other comorbid conditions and the progressive nature of this disease.  4. Follow-up: Will be in 3 months.  [facility], MD 3/17/201711:49 AM

## 2015-11-15 ENCOUNTER — Other Ambulatory Visit: Payer: Self-pay | Admitting: Oncology

## 2015-11-16 ENCOUNTER — Ambulatory Visit (HOSPITAL_BASED_OUTPATIENT_CLINIC_OR_DEPARTMENT_OTHER): Payer: Medicare Other

## 2015-11-16 ENCOUNTER — Other Ambulatory Visit (HOSPITAL_BASED_OUTPATIENT_CLINIC_OR_DEPARTMENT_OTHER): Payer: Medicare Other

## 2015-11-16 VITALS — BP 118/81 | HR 103 | Temp 98.3°F

## 2015-11-16 DIAGNOSIS — D631 Anemia in chronic kidney disease: Secondary | ICD-10-CM | POA: Diagnosis not present

## 2015-11-16 DIAGNOSIS — N189 Chronic kidney disease, unspecified: Secondary | ICD-10-CM

## 2015-11-16 DIAGNOSIS — D469 Myelodysplastic syndrome, unspecified: Secondary | ICD-10-CM

## 2015-11-16 DIAGNOSIS — D45 Polycythemia vera: Secondary | ICD-10-CM

## 2015-11-16 DIAGNOSIS — D638 Anemia in other chronic diseases classified elsewhere: Secondary | ICD-10-CM

## 2015-11-16 LAB — CBC WITH DIFFERENTIAL/PLATELET
BASO%: 1.3 % (ref 0.0–2.0)
Basophils Absolute: 1.1 10*3/uL — ABNORMAL HIGH (ref 0.0–0.1)
EOS ABS: 0.4 10*3/uL (ref 0.0–0.5)
EOS%: 0.4 % (ref 0.0–7.0)
HCT: 24.2 % — ABNORMAL LOW (ref 38.4–49.9)
HGB: 7.3 g/dL — ABNORMAL LOW (ref 13.0–17.1)
LYMPH%: 4.2 % — AB (ref 14.0–49.0)
MCH: 25.8 pg — ABNORMAL LOW (ref 27.2–33.4)
MCHC: 30.2 g/dL — AB (ref 32.0–36.0)
MCV: 85.4 fL (ref 79.3–98.0)
MONO#: 4.7 10*3/uL — AB (ref 0.1–0.9)
MONO%: 5.7 % (ref 0.0–14.0)
NEUT%: 88.4 % — AB (ref 39.0–75.0)
NEUTROS ABS: 73.1 10*3/uL — AB (ref 1.5–6.5)
Platelets: 366 10*3/uL (ref 140–400)
RBC: 2.84 10*6/uL — AB (ref 4.20–5.82)
RDW: 23.8 % — AB (ref 11.0–14.6)
WBC: 82.8 10*3/uL — AB (ref 4.0–10.3)
lymph#: 3.5 10*3/uL — ABNORMAL HIGH (ref 0.9–3.3)

## 2015-11-16 LAB — TECHNOLOGIST REVIEW

## 2015-11-16 MED ORDER — DARBEPOETIN ALFA 300 MCG/0.6ML IJ SOSY
300.0000 ug | PREFILLED_SYRINGE | Freq: Once | INTRAMUSCULAR | Status: AC
  Administered 2015-11-16: 300 ug via SUBCUTANEOUS
  Filled 2015-11-16: qty 0.6

## 2015-12-07 ENCOUNTER — Ambulatory Visit (HOSPITAL_BASED_OUTPATIENT_CLINIC_OR_DEPARTMENT_OTHER): Payer: Medicare Other

## 2015-12-07 ENCOUNTER — Other Ambulatory Visit (HOSPITAL_BASED_OUTPATIENT_CLINIC_OR_DEPARTMENT_OTHER): Payer: Medicare Other

## 2015-12-07 VITALS — BP 99/73 | HR 98 | Temp 98.5°F

## 2015-12-07 DIAGNOSIS — D471 Chronic myeloproliferative disease: Secondary | ICD-10-CM | POA: Diagnosis not present

## 2015-12-07 DIAGNOSIS — D631 Anemia in chronic kidney disease: Secondary | ICD-10-CM

## 2015-12-07 DIAGNOSIS — N189 Chronic kidney disease, unspecified: Secondary | ICD-10-CM

## 2015-12-07 DIAGNOSIS — D638 Anemia in other chronic diseases classified elsewhere: Secondary | ICD-10-CM

## 2015-12-07 DIAGNOSIS — D45 Polycythemia vera: Secondary | ICD-10-CM

## 2015-12-07 DIAGNOSIS — D469 Myelodysplastic syndrome, unspecified: Secondary | ICD-10-CM

## 2015-12-07 LAB — CBC WITH DIFFERENTIAL/PLATELET
HEMATOCRIT: 24.2 % — AB (ref 38.4–49.9)
HEMOGLOBIN: 7.1 g/dL — AB (ref 13.0–17.1)
MCH: 24.6 pg — AB (ref 27.2–33.4)
MCHC: 29.1 g/dL — ABNORMAL LOW (ref 32.0–36.0)
MCV: 84.4 fL (ref 79.3–98.0)
Platelets: 388 10*3/uL (ref 140–400)
RBC: 2.87 10*6/uL — ABNORMAL LOW (ref 4.20–5.82)
RDW: 24.5 % — ABNORMAL HIGH (ref 11.0–14.6)
WBC: 100.5 10*3/uL (ref 4.0–10.3)

## 2015-12-07 LAB — MANUAL DIFFERENTIAL
ALC: 4 10*3/uL — AB (ref 0.9–3.3)
ANC (CHCC manual diff): 90.5 10*3/uL — ABNORMAL HIGH (ref 1.5–6.5)
Band Neutrophils: 15 % — ABNORMAL HIGH (ref 0–10)
Basophil: 2 % (ref 0–2)
LYMPH: 4 % — AB (ref 14–49)
MONO: 3 % (ref 0–14)
MYELOCYTES: 10 % — AB (ref 0–0)
Metamyelocytes: 10 % — ABNORMAL HIGH (ref 0–0)
PLT EST: ADEQUATE
PROMYELO: 1 % — ABNORMAL HIGH (ref 0–0)
SEG: 55 % (ref 38–77)

## 2015-12-07 LAB — FERRITIN: FERRITIN: 317 ng/mL — AB (ref 22–316)

## 2015-12-07 LAB — IRON AND TIBC
%SAT: 24 % (ref 20–55)
IRON: 74 ug/dL (ref 42–163)
TIBC: 301 ug/dL (ref 202–409)
UIBC: 228 ug/dL (ref 117–376)

## 2015-12-07 MED ORDER — DARBEPOETIN ALFA 300 MCG/0.6ML IJ SOSY
300.0000 ug | PREFILLED_SYRINGE | Freq: Once | INTRAMUSCULAR | Status: AC
  Administered 2015-12-07: 300 ug via SUBCUTANEOUS
  Filled 2015-12-07: qty 0.6

## 2015-12-28 ENCOUNTER — Ambulatory Visit (HOSPITAL_BASED_OUTPATIENT_CLINIC_OR_DEPARTMENT_OTHER): Payer: Medicare Other

## 2015-12-28 ENCOUNTER — Encounter: Payer: Self-pay | Admitting: *Deleted

## 2015-12-28 ENCOUNTER — Other Ambulatory Visit (HOSPITAL_BASED_OUTPATIENT_CLINIC_OR_DEPARTMENT_OTHER): Payer: Medicare Other

## 2015-12-28 VITALS — BP 92/57 | HR 93 | Temp 98.3°F | Resp 16

## 2015-12-28 DIAGNOSIS — D638 Anemia in other chronic diseases classified elsewhere: Secondary | ICD-10-CM

## 2015-12-28 DIAGNOSIS — D469 Myelodysplastic syndrome, unspecified: Secondary | ICD-10-CM

## 2015-12-28 DIAGNOSIS — D631 Anemia in chronic kidney disease: Secondary | ICD-10-CM | POA: Diagnosis not present

## 2015-12-28 DIAGNOSIS — N189 Chronic kidney disease, unspecified: Secondary | ICD-10-CM

## 2015-12-28 DIAGNOSIS — D471 Chronic myeloproliferative disease: Secondary | ICD-10-CM

## 2015-12-28 DIAGNOSIS — D45 Polycythemia vera: Secondary | ICD-10-CM

## 2015-12-28 LAB — CBC WITH DIFFERENTIAL/PLATELET
HCT: 23.6 % — ABNORMAL LOW (ref 38.4–49.9)
HEMOGLOBIN: 7.3 g/dL — AB (ref 13.0–17.1)
MCH: 25.7 pg — ABNORMAL LOW (ref 27.2–33.4)
MCHC: 30.9 g/dL — ABNORMAL LOW (ref 32.0–36.0)
MCV: 83.1 fL (ref 79.3–98.0)
Platelets: 318 10*3/uL (ref 140–400)
RBC: 2.84 10*6/uL — AB (ref 4.20–5.82)
RDW: 24.9 % — AB (ref 11.0–14.6)
WBC: 89.2 10*3/uL — AB (ref 4.0–10.3)

## 2015-12-28 LAB — MANUAL DIFFERENTIAL
ALC: 5.4 10*3/uL — AB (ref 0.9–3.3)
ANC (CHCC MAN DIFF): 76.7 10*3/uL — AB (ref 1.5–6.5)
BLASTS: 1 % — AB (ref 0–0)
Band Neutrophils: 8 % (ref 0–10)
Basophil: 2 % (ref 0–2)
EOS%: 1 % (ref 0–7)
LYMPH: 6 % — ABNORMAL LOW (ref 14–49)
METAMYELOCYTES PCT: 17 % — AB (ref 0–0)
MONO: 4 % (ref 0–14)
MYELOCYTES: 15 % — AB (ref 0–0)
Other Cell: 0 % (ref 0–0)
PLT EST: ADEQUATE
PROMYELO: 0 % (ref 0–0)
SEG: 46 % (ref 38–77)
VARIANT LYMPH: 0 % (ref 0–0)
nRBC: 1 % — ABNORMAL HIGH (ref 0–0)

## 2015-12-28 MED ORDER — DARBEPOETIN ALFA 300 MCG/0.6ML IJ SOSY
300.0000 ug | PREFILLED_SYRINGE | Freq: Once | INTRAMUSCULAR | Status: AC
  Administered 2015-12-28: 300 ug via SUBCUTANEOUS
  Filled 2015-12-28: qty 0.6

## 2015-12-28 NOTE — Progress Notes (Signed)
Pt here for Aranesp injection.  Hgb 7.3 today.  Denied shortness of breath, denied increased fatigue.  Stated he was " feeling fine ".  Asked if pt wished to have blood transfusion due to low Hgb, pt stated " No, just the shot ".  Pt understood to call office or go to ER over weekend if shortness of breath and/or increased fatigue symptoms occur.   Both wife and pt voiced understanding.

## 2016-01-09 ENCOUNTER — Ambulatory Visit: Payer: Medicare Other | Admitting: Family Medicine

## 2016-01-17 ENCOUNTER — Telehealth: Payer: Self-pay | Admitting: Oncology

## 2016-01-17 ENCOUNTER — Other Ambulatory Visit (HOSPITAL_BASED_OUTPATIENT_CLINIC_OR_DEPARTMENT_OTHER): Payer: Medicare Other

## 2016-01-17 ENCOUNTER — Ambulatory Visit (HOSPITAL_BASED_OUTPATIENT_CLINIC_OR_DEPARTMENT_OTHER): Payer: Medicare Other

## 2016-01-17 ENCOUNTER — Ambulatory Visit (HOSPITAL_BASED_OUTPATIENT_CLINIC_OR_DEPARTMENT_OTHER): Payer: Medicare Other | Admitting: Oncology

## 2016-01-17 VITALS — BP 82/54 | HR 105 | Temp 98.1°F | Resp 18 | Ht 70.0 in | Wt 171.8 lb

## 2016-01-17 DIAGNOSIS — N189 Chronic kidney disease, unspecified: Secondary | ICD-10-CM

## 2016-01-17 DIAGNOSIS — D631 Anemia in chronic kidney disease: Secondary | ICD-10-CM

## 2016-01-17 DIAGNOSIS — D638 Anemia in other chronic diseases classified elsewhere: Secondary | ICD-10-CM

## 2016-01-17 DIAGNOSIS — D7581 Myelofibrosis: Secondary | ICD-10-CM

## 2016-01-17 DIAGNOSIS — D45 Polycythemia vera: Secondary | ICD-10-CM

## 2016-01-17 DIAGNOSIS — D473 Essential (hemorrhagic) thrombocythemia: Secondary | ICD-10-CM

## 2016-01-17 DIAGNOSIS — D469 Myelodysplastic syndrome, unspecified: Secondary | ICD-10-CM

## 2016-01-17 LAB — CBC WITH DIFFERENTIAL/PLATELET
HEMATOCRIT: 24.3 % — AB (ref 38.4–49.9)
HEMOGLOBIN: 7.1 g/dL — AB (ref 13.0–17.1)
MCH: 25 pg — ABNORMAL LOW (ref 27.2–33.4)
MCHC: 29.3 g/dL — ABNORMAL LOW (ref 32.0–36.0)
MCV: 85.3 fL (ref 79.3–98.0)
Platelets: 347 10*3/uL (ref 140–400)
RBC: 2.85 10*6/uL — ABNORMAL LOW (ref 4.20–5.82)
RDW: 24.3 % — ABNORMAL HIGH (ref 11.0–14.6)
WBC: 79 10*3/uL (ref 4.0–10.3)

## 2016-01-17 LAB — MANUAL DIFFERENTIAL
ALC: 4 10*3/uL — AB (ref 0.9–3.3)
ANC (CHCC MAN DIFF): 67.9 10*3/uL — AB (ref 1.5–6.5)
BLASTS: 0 % (ref 0–0)
Band Neutrophils: 10 % (ref 0–10)
Basophil: 5 % — ABNORMAL HIGH (ref 0–2)
EOS%: 1 % (ref 0–7)
LYMPH: 5 % — ABNORMAL LOW (ref 14–49)
METAMYELOCYTES PCT: 14 % — AB (ref 0–0)
MONO: 3 % (ref 0–14)
MYELOCYTES: 9 % — AB (ref 0–0)
Other Cell: 0 % (ref 0–0)
PLT EST: ADEQUATE
PROMYELO: 0 % (ref 0–0)
SEG: 53 % (ref 38–77)
VARIANT LYMPH: 0 % (ref 0–0)
nRBC: 0 % (ref 0–0)

## 2016-01-17 MED ORDER — DARBEPOETIN ALFA 300 MCG/0.6ML IJ SOSY
300.0000 ug | PREFILLED_SYRINGE | Freq: Once | INTRAMUSCULAR | Status: AC
  Administered 2016-01-17: 300 ug via SUBCUTANEOUS
  Filled 2016-01-17: qty 0.6

## 2016-01-17 NOTE — Progress Notes (Signed)
Hematology and Oncology Follow Up Visit  John [name] [id number] [birth year] 75 y.o. 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 He was diagnosed at [facility] hematology oncology by Dr. Rudean [name] and underwent a bone marrow biopsy on 03/11/2012.   He has anemia of renal disease given his chronic renal insufficiency.  Prior Therapy: He was started on hydroxyurea and tolerated it poorly in 2013. He developed pancytopenia and required hospitalization back in October of 2013   Current therapy: Aranesp 300 g every 3 weeks to get his hemoglobin to 10.  Interim History: John [name] presents today for a followup visit. Since the last visit, he reports feeling reasonably fair. He continues receiving Aranesp with slight improvement in his overall health. He reports his appetite is better and continues to gain weight. His activity level has improved and his mobility have been stable. He continues to have episodic orthostasis although no falls or syncope. He denied any abdominal pain or gross satiety. He denied any constitutional symptoms.  He does not report any complications associated with Aranesp. He denied any injection-related complications or hypertension. He does report occasional pruritus which is unchanged.  He has not reported any headaches, blurry vision, syncope or seizures. He does not report any fevers, chills, sweats or weight loss. He does not report any chest pain, palpitation orthopnea. Does not report any cough, wheezing or hemoptysis. Does not report a nausea, vomiting is report some lower abdomen discomfort. Does not report any frequency urgency or hesitancy. Remaining review of systems unremarkable.  Medications: I have reviewed the patient's current medications.  Current Outpatient  Prescriptions  Medication Sig Dispense Refill  . divalproex (DEPAKOTE SPRINKLE) 125 MG capsule Take 125 mg by mouth 4 (four) times daily.     Marland Kitchen doxylamine, Sleep, (UNISOM) 25 MG tablet Take 25 mg by mouth at bedtime as needed for sleep.    . Multiple Vitamins-Minerals (CENTRUM ADULTS PO) Take 1 tablet by mouth.    . oxyCODONE (OXY IR/ROXICODONE) 5 MG immediate release tablet Take 1 tablet (5 mg total) by mouth every 4 (four) hours as needed for moderate pain. 30 tablet 0  . risperiDONE (RISPERDAL) 1 MG tablet Take 1 mg by mouth daily.     . sertraline (ZOLOFT) 100 MG tablet Take 150 mg by mouth every evening.      No current facility-administered medications for this visit.     Allergies:  Allergies  Allergen Reactions  . Latex     REACTION: rash    Past Medical History, Surgical history, Social history, and Family History were reviewed and updated.  Physical Exam: Blood pressure 82/54, pulse 105, temperature 98.1 F (36.7 C), temperature source Oral, resp. rate 18, height _0  (1.778 m), weight 171 lb 12.8 oz (77.928 kg), SpO2 99 %. ECOG: 1 General appearance: Chronically ill-appearing gentleman without distress. Head: Normocephalic, without obvious abnormality without oral thrush noted. Neck: no adenopathy Lymph nodes: Cervical, supraclavicular, and axillary nodes normal. Heart:regular rate and rhythm, S1, S2 normal, no murmur, click, rub or gallop Lung:chest clear, no wheezing, rales, normal symmetric air entry Abdomin: soft, non-tender, without masses. Splenomegaly noted on exam. EXT:no erythema, induration, or nodules   Lab Results: Lab Results  Component Value Date   WBC 79.0* 01/17/2016   HGB 7.1* 01/17/2016   HCT 24.3* 01/17/2016   MCV 85.3 01/17/2016   PLT 347 01/17/2016  Chemistry      Component Value Date/Time   NA 140 08/17/2015 0521   NA 140 11/18/2013 1421   K 5.3* 08/17/2015 0521   K 4.6 11/18/2013 1421   CL 112* 08/17/2015 0521   CO2 20*  08/17/2015 0521   CO2 21* 11/18/2013 1421   BUN 28* 08/17/2015 0521   BUN 21.8 11/18/2013 1421   CREATININE 1.37* 08/17/2015 0521   CREATININE 1.4* 11/18/2013 1421      Component Value Date/Time   CALCIUM 9.0 08/17/2015 0521   CALCIUM 9.1 11/18/2013 1421   ALKPHOS 59 08/15/2015 1955   ALKPHOS 41 11/18/2013 1421   AST 24 08/15/2015 1955   AST 24 11/18/2013 1421   ALT 18 08/15/2015 1955   ALT 8 11/18/2013 1421   BILITOT 0.1* 08/15/2015 1955   BILITOT 0.29 11/18/2013 1421      Impression and Plan:  75 year old gentleman with the following issues:  1. Myeloproliferative disorder with myelofibrosis: He has JAK 2 positive mutation diagnosed in 2013. He was on hydroxyurea briefly but tolerated it poorly. He appears to have progressive disease with splenomegaly, leukocytosis and anemia.  He is not a candidate for any aggressive therapy at this time and I recommended supportive care only. Cytoreduction would be difficult given his profound anemia and his poor tolerance to hydroxyurea in the past. He does not have constitutional symptoms or pain from his splenomegaly that warrants Jakafi. Also his level of anemia could be prohibitive at this time.   2. Anemia: He has anemia of renal disease. He continues to refuse packed red cell transfusions. His hemoglobin Has improved with growth factor support in the form of Aranesp. His iron studies in April 2017 where adequately repleted.  Risks and benefits of continuing this treatment was discussed and he is agreeable to continue.  3. Prognosis: Relatively poor given his other comorbid conditions and the progressive nature of this disease. This was discussed again today and he understands the ramifications of progressive myelofibrosis.  4. Follow-up: Will be in 3 months.  John Button, MD 6/8/20171:49 PM

## 2016-01-17 NOTE — Addendum Note (Signed)
Addended by: Randolm Idol on: 01/17/2016 02:11 PM   Modules accepted: Medications

## 2016-01-17 NOTE — Patient Instructions (Signed)
Darbepoetin Alfa injection What is this medicine? DARBEPOETIN ALFA (dar be POE e tin AL fa) helps your body make more red blood cells. It is used to treat anemia caused by chronic kidney failure and chemotherapy. This medicine may be used for other purposes; ask your health care provider or pharmacist if you have questions. What should I tell my health care provider before I take this medicine? They need to know if you have any of these conditions: -blood clotting disorders or history of blood clots -cancer patient not on chemotherapy -cystic fibrosis -heart disease, such as angina, heart failure, or a history of a heart attack -hemoglobin level of 12 g/dL or greater -high blood pressure -low levels of folate, iron, or vitamin B12 -seizures -an unusual or allergic reaction to darbepoetin, erythropoietin, albumin, hamster proteins, latex, other medicines, foods, dyes, or preservatives -pregnant or trying to get pregnant -breast-feeding How should I use this medicine? This medicine is for injection into a vein or under the skin. It is usually given by a health care professional in a hospital or clinic setting. If you get this medicine at home, you will be taught how to prepare and give this medicine. Do not shake the solution before you withdraw a dose. Use exactly as directed. Take your medicine at regular intervals. Do not take your medicine more often than directed. It is important that you put your used needles and syringes in a special sharps container. Do not put them in a trash can. If you do not have a sharps container, call your pharmacist or healthcare provider to get one. Talk to your pediatrician regarding the use of this medicine in children. While this medicine may be used in children as young as 1 year for selected conditions, precautions do apply. Overdosage: If you think you have taken too much of this medicine contact a poison control center or emergency room at once. NOTE:  This medicine is only for you. Do not share this medicine with others. What if I miss a dose? If you miss a dose, take it as soon as you can. If it is almost time for your next dose, take only that dose. Do not take double or extra doses. What may interact with this medicine? Do not take this medicine with any of the following medications: -epoetin alfa This list may not describe all possible interactions. Give your health care provider a list of all the medicines, herbs, non-prescription drugs, or dietary supplements you use. Also tell them if you smoke, drink alcohol, or use illegal drugs. Some items may interact with your medicine. What should I watch for while using this medicine? Visit your prescriber or health care professional for regular checks on your progress and for the needed blood tests and blood pressure measurements. It is especially important for the doctor to make sure your hemoglobin level is in the desired range, to limit the risk of potential side effects and to give you the best benefit. Keep all appointments for any recommended tests. Check your blood pressure as directed. Ask your doctor what your blood pressure should be and when you should contact him or her. As your body makes more red blood cells, you may need to take iron, folic acid, or vitamin B supplements. Ask your doctor or health care provider which products are right for you. If you have kidney disease continue dietary restrictions, even though this medication can make you feel better. Talk with your doctor or health care professional about the   foods you eat and the vitamins that you take. What side effects may I notice from receiving this medicine? Side effects that you should report to your doctor or health care professional as soon as possible: -allergic reactions like skin rash, itching or hives, swelling of the face, lips, or tongue -breathing problems -changes in vision -chest pain -confusion, trouble speaking  or understanding -feeling faint or lightheaded, falls -high blood pressure -muscle aches or pains -pain, swelling, warmth in the leg -rapid weight gain -severe headaches -sudden numbness or weakness of the face, arm or leg -trouble walking, dizziness, loss of balance or coordination -seizures (convulsions) -swelling of the ankles, feet, hands -unusually weak or tired Side effects that usually do not require medical attention (report to your doctor or health care professional if they continue or are bothersome): -diarrhea -fever, chills (flu-like symptoms) -headaches -nausea, vomiting -redness, stinging, or swelling at site where injected This list may not describe all possible side effects. Call your doctor for medical advice about side effects. You may report side effects to FDA at 1-800-FDA-1088. Where should I keep my medicine? Keep out of the reach of children. Store in a refrigerator between 2 and 8 degrees C (36 and 46 degrees F). Do not freeze. Do not shake. Throw away any unused portion if using a single-dose vial. Throw away any unused medicine after the expiration date. NOTE: This sheet is a summary. It may not cover all possible information. If you have questions about this medicine, talk to your doctor, pharmacist, or health care provider.    2016, [employer]. (2008-07-11 10:23:57)  

## 2016-01-17 NOTE — Telephone Encounter (Signed)
Gave pt apt & avs °

## 2016-02-07 ENCOUNTER — Ambulatory Visit (HOSPITAL_BASED_OUTPATIENT_CLINIC_OR_DEPARTMENT_OTHER): Payer: Medicare Other

## 2016-02-07 ENCOUNTER — Other Ambulatory Visit (HOSPITAL_BASED_OUTPATIENT_CLINIC_OR_DEPARTMENT_OTHER): Payer: Medicare Other

## 2016-02-07 VITALS — BP 105/67 | HR 105 | Temp 98.3°F | Resp 22

## 2016-02-07 DIAGNOSIS — D638 Anemia in other chronic diseases classified elsewhere: Secondary | ICD-10-CM

## 2016-02-07 DIAGNOSIS — N189 Chronic kidney disease, unspecified: Secondary | ICD-10-CM

## 2016-02-07 DIAGNOSIS — D631 Anemia in chronic kidney disease: Secondary | ICD-10-CM

## 2016-02-07 DIAGNOSIS — D7581 Myelofibrosis: Secondary | ICD-10-CM | POA: Diagnosis not present

## 2016-02-07 LAB — MANUAL DIFFERENTIAL
ALC: 3.6 10*3/uL — AB (ref 0.9–3.3)
ANC (CHCC MAN DIFF): 77.6 10*3/uL — AB (ref 1.5–6.5)
BASOPHIL: 2 % (ref 0–2)
Band Neutrophils: 18 % — ABNORMAL HIGH (ref 0–10)
Blasts: 0 % (ref 0–0)
EOS%: 2 % (ref 0–7)
LYMPH: 4 % — ABNORMAL LOW (ref 14–49)
METAMYELOCYTES PCT: 8 % — AB (ref 0–0)
MONO: 5 % (ref 0–14)
MYELOCYTES: 12 % — AB (ref 0–0)
Other Cell: 0 % (ref 0–0)
PLT EST: ADEQUATE
PROMYELO: 0 % (ref 0–0)
SEG: 49 % (ref 38–77)
VARIANT LYMPH: 0 % (ref 0–0)
nRBC: 0 % (ref 0–0)

## 2016-02-07 LAB — CBC WITH DIFFERENTIAL/PLATELET
HCT: 23 % — ABNORMAL LOW (ref 38.4–49.9)
HGB: 7.2 g/dL — ABNORMAL LOW (ref 13.0–17.1)
MCH: 26.2 pg — ABNORMAL LOW (ref 27.2–33.4)
MCHC: 31.3 g/dL — AB (ref 32.0–36.0)
MCV: 83.6 fL (ref 79.3–98.0)
PLATELETS: 466 10*3/uL — AB (ref 140–400)
RBC: 2.75 10*6/uL — AB (ref 4.20–5.82)
RDW: 25.8 % — ABNORMAL HIGH (ref 11.0–14.6)
WBC: 89.2 10*3/uL — AB (ref 4.0–10.3)

## 2016-02-07 LAB — IRON AND TIBC
%SAT: 24 % (ref 20–55)
Iron: 74 ug/dL (ref 42–163)
TIBC: 310 ug/dL (ref 202–409)
UIBC: 236 ug/dL (ref 117–376)

## 2016-02-07 LAB — FERRITIN: Ferritin: 303 ng/ml (ref 22–316)

## 2016-02-07 MED ORDER — DARBEPOETIN ALFA 300 MCG/0.6ML IJ SOSY
300.0000 ug | PREFILLED_SYRINGE | Freq: Once | INTRAMUSCULAR | Status: AC
  Administered 2016-02-07: 300 ug via SUBCUTANEOUS
  Filled 2016-02-07: qty 0.6

## 2016-02-07 NOTE — Patient Instructions (Signed)
Darbepoetin Alfa injection What is this medicine? DARBEPOETIN ALFA (dar be POE e tin AL fa) helps your body make more red blood cells. It is used to treat anemia caused by chronic kidney failure and chemotherapy. This medicine may be used for other purposes; ask your health care provider or pharmacist if you have questions. What should I tell my health care provider before I take this medicine? They need to know if you have any of these conditions: -blood clotting disorders or history of blood clots -cancer patient not on chemotherapy -cystic fibrosis -heart disease, such as angina, heart failure, or a history of a heart attack -hemoglobin level of 12 g/dL or greater -high blood pressure -low levels of folate, iron, or vitamin B12 -seizures -an unusual or allergic reaction to darbepoetin, erythropoietin, albumin, hamster proteins, latex, other medicines, foods, dyes, or preservatives -pregnant or trying to get pregnant -breast-feeding How should I use this medicine? This medicine is for injection into a vein or under the skin. It is usually given by a health care professional in a hospital or clinic setting. If you get this medicine at home, you will be taught how to prepare and give this medicine. Do not shake the solution before you withdraw a dose. Use exactly as directed. Take your medicine at regular intervals. Do not take your medicine more often than directed. It is important that you put your used needles and syringes in a special sharps container. Do not put them in a trash can. If you do not have a sharps container, call your pharmacist or healthcare provider to get one. Talk to your pediatrician regarding the use of this medicine in children. While this medicine may be used in children as young as 1 year for selected conditions, precautions do apply. Overdosage: If you think you have taken too much of this medicine contact a poison control center or emergency room at once. NOTE:  This medicine is only for you. Do not share this medicine with others. What if I miss a dose? If you miss a dose, take it as soon as you can. If it is almost time for your next dose, take only that dose. Do not take double or extra doses. What may interact with this medicine? Do not take this medicine with any of the following medications: -epoetin alfa This list may not describe all possible interactions. Give your health care provider a list of all the medicines, herbs, non-prescription drugs, or dietary supplements you use. Also tell them if you smoke, drink alcohol, or use illegal drugs. Some items may interact with your medicine. What should I watch for while using this medicine? Visit your prescriber or health care professional for regular checks on your progress and for the needed blood tests and blood pressure measurements. It is especially important for the doctor to make sure your hemoglobin level is in the desired range, to limit the risk of potential side effects and to give you the best benefit. Keep all appointments for any recommended tests. Check your blood pressure as directed. Ask your doctor what your blood pressure should be and when you should contact him or her. As your body makes more red blood cells, you may need to take iron, folic acid, or vitamin B supplements. Ask your doctor or health care provider which products are right for you. If you have kidney disease continue dietary restrictions, even though this medication can make you feel better. Talk with your doctor or health care professional about the   foods you eat and the vitamins that you take. What side effects may I notice from receiving this medicine? Side effects that you should report to your doctor or health care professional as soon as possible: -allergic reactions like skin rash, itching or hives, swelling of the face, lips, or tongue -breathing problems -changes in vision -chest pain -confusion, trouble speaking  or understanding -feeling faint or lightheaded, falls -high blood pressure -muscle aches or pains -pain, swelling, warmth in the leg -rapid weight gain -severe headaches -sudden numbness or weakness of the face, arm or leg -trouble walking, dizziness, loss of balance or coordination -seizures (convulsions) -swelling of the ankles, feet, hands -unusually weak or tired Side effects that usually do not require medical attention (report to your doctor or health care professional if they continue or are bothersome): -diarrhea -fever, chills (flu-like symptoms) -headaches -nausea, vomiting -redness, stinging, or swelling at site where injected This list may not describe all possible side effects. Call your doctor for medical advice about side effects. You may report side effects to FDA at 1-800-FDA-1088. Where should I keep my medicine? Keep out of the reach of children. Store in a refrigerator between 2 and 8 degrees C (36 and 46 degrees F). Do not freeze. Do not shake. Throw away any unused portion if using a single-dose vial. Throw away any unused medicine after the expiration date. NOTE: This sheet is a summary. It may not cover all possible information. If you have questions about this medicine, talk to your doctor, pharmacist, or health care provider.    2016, [employer]. (2008-07-11 10:23:57)  

## 2016-02-28 ENCOUNTER — Encounter: Payer: Self-pay | Admitting: *Deleted

## 2016-02-28 ENCOUNTER — Other Ambulatory Visit (HOSPITAL_BASED_OUTPATIENT_CLINIC_OR_DEPARTMENT_OTHER): Payer: Medicare Other

## 2016-02-28 ENCOUNTER — Ambulatory Visit (HOSPITAL_BASED_OUTPATIENT_CLINIC_OR_DEPARTMENT_OTHER): Payer: Medicare Other

## 2016-02-28 VITALS — BP 93/61 | HR 77 | Temp 98.2°F | Resp 20

## 2016-02-28 DIAGNOSIS — D7581 Myelofibrosis: Secondary | ICD-10-CM | POA: Diagnosis not present

## 2016-02-28 DIAGNOSIS — D631 Anemia in chronic kidney disease: Secondary | ICD-10-CM | POA: Diagnosis not present

## 2016-02-28 DIAGNOSIS — N189 Chronic kidney disease, unspecified: Secondary | ICD-10-CM | POA: Diagnosis not present

## 2016-02-28 DIAGNOSIS — D638 Anemia in other chronic diseases classified elsewhere: Secondary | ICD-10-CM

## 2016-02-28 LAB — CBC WITH DIFFERENTIAL/PLATELET
BASO%: 1.5 % (ref 0.0–2.0)
BASOS ABS: 1 10*3/uL — AB (ref 0.0–0.1)
EOS ABS: 0.2 10*3/uL (ref 0.0–0.5)
EOS%: 0.3 % (ref 0.0–7.0)
HCT: 22.1 % — ABNORMAL LOW (ref 38.4–49.9)
HGB: 6.7 g/dL — CL (ref 13.0–17.1)
LYMPH%: 4.7 % — AB (ref 14.0–49.0)
MCH: 25.7 pg — AB (ref 27.2–33.4)
MCHC: 30.3 g/dL — ABNORMAL LOW (ref 32.0–36.0)
MCV: 84.8 fL (ref 79.3–98.0)
MONO#: 4.2 10*3/uL — ABNORMAL HIGH (ref 0.1–0.9)
MONO%: 6.4 % (ref 0.0–14.0)
NEUT#: 57.7 10*3/uL — ABNORMAL HIGH (ref 1.5–6.5)
NEUT%: 87.1 % — AB (ref 39.0–75.0)
PLATELETS: 359 10*3/uL (ref 140–400)
RBC: 2.6 10*6/uL — AB (ref 4.20–5.82)
RDW: 24.7 % — ABNORMAL HIGH (ref 11.0–14.6)
WBC: 66.3 10*3/uL (ref 4.0–10.3)
lymph#: 3.1 10*3/uL (ref 0.9–3.3)
nRBC: 0 % (ref 0–0)

## 2016-02-28 LAB — TECHNOLOGIST REVIEW

## 2016-02-28 MED ORDER — DARBEPOETIN ALFA 300 MCG/0.6ML IJ SOSY
300.0000 ug | PREFILLED_SYRINGE | Freq: Once | INTRAMUSCULAR | Status: AC
  Administered 2016-02-28: 300 ug via SUBCUTANEOUS
  Filled 2016-02-28: qty 0.6

## 2016-02-28 NOTE — Patient Instructions (Signed)
Darbepoetin Alfa injection What is this medicine? DARBEPOETIN ALFA (dar be POE e tin AL fa) helps your body make more red blood cells. It is used to treat anemia caused by chronic kidney failure and chemotherapy. This medicine may be used for other purposes; ask your health care provider or pharmacist if you have questions. What should I tell my health care provider before I take this medicine? They need to know if you have any of these conditions: -blood clotting disorders or history of blood clots -cancer patient not on chemotherapy -cystic fibrosis -heart disease, such as angina, heart failure, or a history of a heart attack -hemoglobin level of 12 g/dL or greater -high blood pressure -low levels of folate, iron, or vitamin B12 -seizures -an unusual or allergic reaction to darbepoetin, erythropoietin, albumin, hamster proteins, latex, other medicines, foods, dyes, or preservatives -pregnant or trying to get pregnant -breast-feeding How should I use this medicine? This medicine is for injection into a vein or under the skin. It is usually given by a health care professional in a hospital or clinic setting. If you get this medicine at home, you will be taught how to prepare and give this medicine. Do not shake the solution before you withdraw a dose. Use exactly as directed. Take your medicine at regular intervals. Do not take your medicine more often than directed. It is important that you put your used needles and syringes in a special sharps container. Do not put them in a trash can. If you do not have a sharps container, call your pharmacist or healthcare provider to get one. Talk to your pediatrician regarding the use of this medicine in children. While this medicine may be used in children as young as 1 year for selected conditions, precautions do apply. Overdosage: If you think you have taken too much of this medicine contact a poison control center or emergency room at once. NOTE:  This medicine is only for you. Do not share this medicine with others. What if I miss a dose? If you miss a dose, take it as soon as you can. If it is almost time for your next dose, take only that dose. Do not take double or extra doses. What may interact with this medicine? Do not take this medicine with any of the following medications: -epoetin alfa This list may not describe all possible interactions. Give your health care provider a list of all the medicines, herbs, non-prescription drugs, or dietary supplements you use. Also tell them if you smoke, drink alcohol, or use illegal drugs. Some items may interact with your medicine. What should I watch for while using this medicine? Visit your prescriber or health care professional for regular checks on your progress and for the needed blood tests and blood pressure measurements. It is especially important for the doctor to make sure your hemoglobin level is in the desired range, to limit the risk of potential side effects and to give you the best benefit. Keep all appointments for any recommended tests. Check your blood pressure as directed. Ask your doctor what your blood pressure should be and when you should contact him or her. As your body makes more red blood cells, you may need to take iron, folic acid, or vitamin B supplements. Ask your doctor or health care provider which products are right for you. If you have kidney disease continue dietary restrictions, even though this medication can make you feel better. Talk with your doctor or health care professional about the   foods you eat and the vitamins that you take. What side effects may I notice from receiving this medicine? Side effects that you should report to your doctor or health care professional as soon as possible: -allergic reactions like skin rash, itching or hives, swelling of the face, lips, or tongue -breathing problems -changes in vision -chest pain -confusion, trouble speaking  or understanding -feeling faint or lightheaded, falls -high blood pressure -muscle aches or pains -pain, swelling, warmth in the leg -rapid weight gain -severe headaches -sudden numbness or weakness of the face, arm or leg -trouble walking, dizziness, loss of balance or coordination -seizures (convulsions) -swelling of the ankles, feet, hands -unusually weak or tired Side effects that usually do not require medical attention (report to your doctor or health care professional if they continue or are bothersome): -diarrhea -fever, chills (flu-like symptoms) -headaches -nausea, vomiting -redness, stinging, or swelling at site where injected This list may not describe all possible side effects. Call your doctor for medical advice about side effects. You may report side effects to FDA at 1-800-FDA-1088. Where should I keep my medicine? Keep out of the reach of children. Store in a refrigerator between 2 and 8 degrees C (36 and 46 degrees F). Do not freeze. Do not shake. Throw away any unused portion if using a single-dose vial. Throw away any unused medicine after the expiration date. NOTE: This sheet is a summary. It may not cover all possible information. If you have questions about this medicine, talk to your doctor, pharmacist, or health care provider.    2016, [employer]. (2008-07-11 10:23:57)  

## 2016-03-20 ENCOUNTER — Ambulatory Visit (HOSPITAL_BASED_OUTPATIENT_CLINIC_OR_DEPARTMENT_OTHER): Payer: Medicare Other

## 2016-03-20 ENCOUNTER — Other Ambulatory Visit (HOSPITAL_BASED_OUTPATIENT_CLINIC_OR_DEPARTMENT_OTHER): Payer: Medicare Other

## 2016-03-20 VITALS — BP 128/56 | HR 67 | Temp 98.3°F | Resp 16

## 2016-03-20 DIAGNOSIS — D631 Anemia in chronic kidney disease: Secondary | ICD-10-CM | POA: Diagnosis not present

## 2016-03-20 DIAGNOSIS — N189 Chronic kidney disease, unspecified: Secondary | ICD-10-CM

## 2016-03-20 DIAGNOSIS — D638 Anemia in other chronic diseases classified elsewhere: Secondary | ICD-10-CM

## 2016-03-20 DIAGNOSIS — D7581 Myelofibrosis: Secondary | ICD-10-CM

## 2016-03-20 LAB — MANUAL DIFFERENTIAL
ALC: 5 10*3/uL — AB (ref 0.9–3.3)
ANC (CHCC MAN DIFF): 59.6 10*3/uL — AB (ref 1.5–6.5)
BASOPHIL: 3 % — AB (ref 0–2)
Band Neutrophils: 21 % — ABNORMAL HIGH (ref 0–10)
Blasts: 0 % (ref 0–0)
EOS%: 1 % (ref 0–7)
LYMPH: 7 % — ABNORMAL LOW (ref 14–49)
METAMYELOCYTES PCT: 14 % — AB (ref 0–0)
MONO: 5 % (ref 0–14)
MYELOCYTES: 11 % — AB (ref 0–0)
OTHER CELL: 0 % (ref 0–0)
PLT EST: INCREASED
PROMYELO: 0 % (ref 0–0)
SEG: 38 % (ref 38–77)
Variant Lymph: 0 % (ref 0–0)
nRBC: 0 % (ref 0–0)

## 2016-03-20 LAB — CBC WITH DIFFERENTIAL/PLATELET
HEMATOCRIT: 22.4 % — AB (ref 38.4–49.9)
HGB: 7 g/dL — ABNORMAL LOW (ref 13.0–17.1)
MCH: 25.8 pg — ABNORMAL LOW (ref 27.2–33.4)
MCHC: 31.3 g/dL — AB (ref 32.0–36.0)
MCV: 82.7 fL (ref 79.3–98.0)
PLATELETS: 468 10*3/uL — AB (ref 140–400)
RBC: 2.71 10*6/uL — AB (ref 4.20–5.82)
RDW: 24.8 % — ABNORMAL HIGH (ref 11.0–14.6)
WBC: 70.9 10*3/uL — AB (ref 4.0–10.3)

## 2016-03-20 MED ORDER — DARBEPOETIN ALFA 300 MCG/0.6ML IJ SOSY
300.0000 ug | PREFILLED_SYRINGE | Freq: Once | INTRAMUSCULAR | Status: AC
  Administered 2016-03-20: 300 ug via SUBCUTANEOUS
  Filled 2016-03-20: qty 0.6

## 2016-03-20 NOTE — Patient Instructions (Signed)
Darbepoetin Alfa injection What is this medicine? DARBEPOETIN ALFA (dar be POE e tin AL fa) helps your body make more red blood cells. It is used to treat anemia caused by chronic kidney failure and chemotherapy. This medicine may be used for other purposes; ask your health care provider or pharmacist if you have questions. What should I tell my health care provider before I take this medicine? They need to know if you have any of these conditions: -blood clotting disorders or history of blood clots -cancer patient not on chemotherapy -cystic fibrosis -heart disease, such as angina, heart failure, or a history of a heart attack -hemoglobin level of 12 g/dL or greater -high blood pressure -low levels of folate, iron, or vitamin B12 -seizures -an unusual or allergic reaction to darbepoetin, erythropoietin, albumin, hamster proteins, latex, other medicines, foods, dyes, or preservatives -pregnant or trying to get pregnant -breast-feeding How should I use this medicine? This medicine is for injection into a vein or under the skin. It is usually given by a health care professional in a hospital or clinic setting. If you get this medicine at home, you will be taught how to prepare and give this medicine. Do not shake the solution before you withdraw a dose. Use exactly as directed. Take your medicine at regular intervals. Do not take your medicine more often than directed. It is important that you put your used needles and syringes in a special sharps container. Do not put them in a trash can. If you do not have a sharps container, call your pharmacist or healthcare provider to get one. Talk to your pediatrician regarding the use of this medicine in children. While this medicine may be used in children as young as 1 year for selected conditions, precautions do apply. Overdosage: If you think you have taken too much of this medicine contact a poison control center or emergency room at once. NOTE:  This medicine is only for you. Do not share this medicine with others. What if I miss a dose? If you miss a dose, take it as soon as you can. If it is almost time for your next dose, take only that dose. Do not take double or extra doses. What may interact with this medicine? Do not take this medicine with any of the following medications: -epoetin alfa This list may not describe all possible interactions. Give your health care provider a list of all the medicines, herbs, non-prescription drugs, or dietary supplements you use. Also tell them if you smoke, drink alcohol, or use illegal drugs. Some items may interact with your medicine. What should I watch for while using this medicine? Visit your prescriber or health care professional for regular checks on your progress and for the needed blood tests and blood pressure measurements. It is especially important for the doctor to make sure your hemoglobin level is in the desired range, to limit the risk of potential side effects and to give you the best benefit. Keep all appointments for any recommended tests. Check your blood pressure as directed. Ask your doctor what your blood pressure should be and when you should contact him or her. As your body makes more red blood cells, you may need to take iron, folic acid, or vitamin B supplements. Ask your doctor or health care provider which products are right for you. If you have kidney disease continue dietary restrictions, even though this medication can make you feel better. Talk with your doctor or health care professional about the   foods you eat and the vitamins that you take. What side effects may I notice from receiving this medicine? Side effects that you should report to your doctor or health care professional as soon as possible: -allergic reactions like skin rash, itching or hives, swelling of the face, lips, or tongue -breathing problems -changes in vision -chest pain -confusion, trouble speaking  or understanding -feeling faint or lightheaded, falls -high blood pressure -muscle aches or pains -pain, swelling, warmth in the leg -rapid weight gain -severe headaches -sudden numbness or weakness of the face, arm or leg -trouble walking, dizziness, loss of balance or coordination -seizures (convulsions) -swelling of the ankles, feet, hands -unusually weak or tired Side effects that usually do not require medical attention (report to your doctor or health care professional if they continue or are bothersome): -diarrhea -fever, chills (flu-like symptoms) -headaches -nausea, vomiting -redness, stinging, or swelling at site where injected This list may not describe all possible side effects. Call your doctor for medical advice about side effects. You may report side effects to FDA at 1-800-FDA-1088. Where should I keep my medicine? Keep out of the reach of children. Store in a refrigerator between 2 and 8 degrees C (36 and 46 degrees F). Do not freeze. Do not shake. Throw away any unused portion if using a single-dose vial. Throw away any unused medicine after the expiration date. NOTE: This sheet is a summary. It may not cover all possible information. If you have questions about this medicine, talk to your doctor, pharmacist, or health care provider.    2016, [employer]. (2008-07-11 10:23:57)  

## 2016-04-10 ENCOUNTER — Ambulatory Visit: Payer: Medicare Other

## 2016-04-10 ENCOUNTER — Encounter: Payer: Self-pay | Admitting: *Deleted

## 2016-04-10 ENCOUNTER — Ambulatory Visit (HOSPITAL_BASED_OUTPATIENT_CLINIC_OR_DEPARTMENT_OTHER): Payer: Medicare Other | Admitting: Oncology

## 2016-04-10 ENCOUNTER — Other Ambulatory Visit (HOSPITAL_BASED_OUTPATIENT_CLINIC_OR_DEPARTMENT_OTHER): Payer: Medicare Other

## 2016-04-10 VITALS — BP 90/67 | HR 68 | Temp 98.5°F | Resp 16 | Ht 70.0 in | Wt 174.2 lb

## 2016-04-10 DIAGNOSIS — D45 Polycythemia vera: Secondary | ICD-10-CM

## 2016-04-10 DIAGNOSIS — D7581 Myelofibrosis: Secondary | ICD-10-CM

## 2016-04-10 DIAGNOSIS — D72829 Elevated white blood cell count, unspecified: Secondary | ICD-10-CM

## 2016-04-10 DIAGNOSIS — D473 Essential (hemorrhagic) thrombocythemia: Secondary | ICD-10-CM

## 2016-04-10 DIAGNOSIS — D631 Anemia in chronic kidney disease: Secondary | ICD-10-CM

## 2016-04-10 DIAGNOSIS — R161 Splenomegaly, not elsewhere classified: Secondary | ICD-10-CM

## 2016-04-10 DIAGNOSIS — N189 Chronic kidney disease, unspecified: Secondary | ICD-10-CM

## 2016-04-10 LAB — MANUAL DIFFERENTIAL
ALC: 6 10*3/uL — AB (ref 0.9–3.3)
ANC (CHCC MAN DIFF): 47.8 10*3/uL — AB (ref 1.5–6.5)
BLASTS: 0 % (ref 0–0)
Band Neutrophils: 6 % (ref 0–10)
Basophil: 2 % (ref 0–2)
EOS%: 1 % (ref 0–7)
LYMPH: 10 % — ABNORMAL LOW (ref 14–49)
MONO: 7 % (ref 0–14)
MYELOCYTES: 6 % — AB (ref 0–0)
Metamyelocytes: 10 % — ABNORMAL HIGH (ref 0–0)
Other Cell: 0 % (ref 0–0)
PLT EST: INCREASED
PROMYELO: 0 % (ref 0–0)
SEG: 58 % (ref 38–77)
VARIANT LYMPH: 0 % (ref 0–0)
nRBC: 1 % — ABNORMAL HIGH (ref 0–0)

## 2016-04-10 LAB — CBC WITH DIFFERENTIAL/PLATELET
HEMATOCRIT: 20.9 % — AB (ref 38.4–49.9)
HGB: 6.3 g/dL — CL (ref 13.0–17.1)
MCH: 24.7 pg — AB (ref 27.2–33.4)
MCHC: 30.1 g/dL — AB (ref 32.0–36.0)
MCV: 82 fL (ref 79.3–98.0)
Platelets: 479 10*3/uL — ABNORMAL HIGH (ref 140–400)
RBC: 2.55 10*6/uL — AB (ref 4.20–5.82)
RDW: 25.1 % — ABNORMAL HIGH (ref 11.0–14.6)
WBC: 59.8 10*3/uL (ref 4.0–10.3)

## 2016-04-10 MED ORDER — HYDROXYZINE HCL 10 MG PO TABS: 10.0000 mg | ORAL_TABLET | Freq: Three times a day (TID) | ORAL | 0 refills | Status: DC | PRN

## 2016-04-10 NOTE — Progress Notes (Signed)
Hematology and Oncology Follow Up Visit  John [name] [id number] 1941/01/03 75 y.o. 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 He was diagnosed at [name] by Dr. Rudean Hitt and underwent a bone marrow biopsy on 03/11/2012.   He has anemia of renal disease given his chronic renal insufficiency.  Prior Therapy: He was started on hydroxyurea and tolerated it poorly in 2013. He developed pancytopenia and required hospitalization back in October of 2013   Current therapy: Aranesp 300 g every 3 weeks to get his hemoglobin to 10.  Interim History: John [name] presents today for a followup visit. Since the last visit, he reports no major changes in his health. He continues to have issues with pleuritis which she takes Benadryl with some relief. He feels that his itching is predominantly nighttime and feels that eating in the middle of night helps his symptoms.   He continues receiving Aranesp with slight improvement in his overall health although it is unclear to him whether there is a dramatic changes. He reports his appetite is better and continues to gain weight. His activity level has improved and his mobility have been stable. He does not report any complications associated with Aranesp. He denied any injection-related complications or hypertension.   He has not reported any headaches, blurry vision, syncope or seizures. He does not report any fevers, chills, sweats or weight loss. He does not report any chest pain, palpitation orthopnea. Does not report any cough, wheezing or hemoptysis. Does not report a nausea, vomiting is report some lower abdomen discomfort. Does not report any frequency urgency or hesitancy. Remaining review of systems unremarkable.  Medications: I have  reviewed the patient's current medications.  Current Outpatient Prescriptions  Medication Sig Dispense Refill  . aspirin (GOODSENSE ASPIRIN) 325 MG tablet Take 325 mg by mouth.    . divalproex (DEPAKOTE SPRINKLE) 125 MG capsule Take 125 mg by mouth 4 (four) times daily.     Marland Kitchen doxylamine, Sleep, (UNISOM) 25 MG tablet Take 25 mg by mouth at bedtime as needed for sleep.    . hydrOXYzine (ATARAX/VISTARIL) 10 MG tablet Take 1 tablet (10 mg total) by mouth 3 (three) times daily as needed. 30 tablet 0  . Multiple Vitamins-Minerals (CENTRUM ADULTS PO) Take 1 tablet by mouth.    . oxyCODONE (OXY IR/ROXICODONE) 5 MG immediate release tablet Take 1 tablet (5 mg total) by mouth every 4 (four) hours as needed for moderate pain. 30 tablet 0  . risperiDONE (RISPERDAL) 1 MG tablet Take 1 mg by mouth daily.     . sertraline (ZOLOFT) 100 MG tablet Take 150 mg by mouth every evening.      No current facility-administered medications for this visit.      Allergies:  Allergies  Allergen Reactions  . Latex     REACTION: rash    Past Medical History, Surgical history, Social history, and Family History were reviewed and updated.  Physical Exam: Blood pressure 90/67, pulse 68, temperature 98.5 F (36.9 C), temperature source Oral, resp. rate 16, height '5\' 10"'$  (1.778 m), weight 174 lb 3.2 oz (79 kg), SpO2 98 %. ECOG: 1 General appearance: Chronically ill-appearing gentleman Appeared comfortable and unchanged from previous examination. Head: Normocephalic, without obvious abnormality without oral ulcers or lesions. Neck: no adenopathy Lymph nodes: Cervical, supraclavicular, and axillary nodes normal. Heart:regular rate and rhythm, S1, S2  normal, no murmur, click, rub or gallop Lung:chest clear, no wheezing, rales, normal symmetric air entry Abdomin: soft, non-tender, without masses. Splenomegaly noted on exam unchanged from previous examination. EXT:no erythema, induration, or nodules   Lab  Results: Lab Results  Component Value Date   WBC 70.9 (HH) 03/20/2016   HGB 7.0 (L) 03/20/2016   HCT 22.4 (L) 03/20/2016   MCV 82.7 03/20/2016   PLT 468 (H) 03/20/2016     Chemistry      Component Value Date/Time   NA 140 08/17/2015 0521   NA 140 11/18/2013 1421   K 5.3 (H) 08/17/2015 0521   K 4.6 11/18/2013 1421   CL 112 (H) 08/17/2015 0521   CO2 20 (L) 08/17/2015 0521   CO2 21 (L) 11/18/2013 1421   BUN 28 (H) 08/17/2015 0521   BUN 21.8 11/18/2013 1421   CREATININE 1.37 (H) 08/17/2015 0521   CREATININE 1.4 (H) 11/18/2013 1421      Component Value Date/Time   CALCIUM 9.0 08/17/2015 0521   CALCIUM 9.1 11/18/2013 1421   ALKPHOS 59 08/15/2015 1955   ALKPHOS 41 11/18/2013 1421   AST 24 08/15/2015 1955   AST 24 11/18/2013 1421   ALT 18 08/15/2015 1955   ALT 8 11/18/2013 1421   BILITOT 0.1 (L) 08/15/2015 1955   BILITOT 0.29 11/18/2013 1421      Impression and Plan:  75 year old gentleman with the following issues:  1. Myeloproliferative disorder with myelofibrosis: He has JAK 2 positive mutation diagnosed in 2013. He was on hydroxyurea briefly but tolerated it poorly. He appears to have progressive disease with splenomegaly, leukocytosis and anemia.  He is not a candidate for any aggressive therapy at this time and I recommended supportive care only. Cytoreduction would be difficult given his profound anemia and his poor tolerance to hydroxyurea in the past.   He does report pruritus and splenomegaly that warrants Jakafi but the degree of anemia is prohibitive at this time. I have recommended continued supportive care at this time.   2. Anemia: He has anemia of renal disease. He continues to refuse packed red cell transfusions. His hemoglobin remained relatively stable without any further decline. Risks and benefits of continuing Aranesp at this time were debated and he decided to proceed with a break to see if there is any major changes in his blood off Aranesp. He is  unsure whether this medication have helped his quality of life and was continuing. We have decided to proceed with a 3 month break and reassess him at that time.    3. Prognosis: poor given his other comorbid conditions and the progressive nature of this disease. His disease status appeared to be relatively stable at this time and we'll continue to address that with him in future visits.  4. Pruritus: Prescription for Atarax was made available patient.  5. Follow-up: Will be in 3 months.  [facility], MD 8/31/201711:53 AM

## 2016-04-18 ENCOUNTER — Other Ambulatory Visit: Payer: Self-pay | Admitting: Oncology

## 2016-05-01 ENCOUNTER — Encounter: Payer: Self-pay | Admitting: *Deleted

## 2016-05-01 ENCOUNTER — Other Ambulatory Visit: Payer: Self-pay | Admitting: *Deleted

## 2016-05-01 MED ORDER — HYDROXYZINE HCL 10 MG PO TABS: 10.0000 mg | ORAL_TABLET | Freq: Three times a day (TID) | ORAL | 0 refills | Status: DC | PRN

## 2016-05-05 ENCOUNTER — Emergency Department (HOSPITAL_COMMUNITY): Payer: Medicare Other

## 2016-05-05 ENCOUNTER — Encounter (HOSPITAL_COMMUNITY): Payer: Self-pay | Admitting: Emergency Medicine

## 2016-05-05 ENCOUNTER — Inpatient Hospital Stay (HOSPITAL_COMMUNITY)
Admission: EM | Admit: 2016-05-05 | Discharge: 2016-05-06 | DRG: 841 | Disposition: A | Discharge status: Hospice | Payer: Medicare Other | Attending: Internal Medicine | Admitting: Internal Medicine

## 2016-05-05 DIAGNOSIS — R161 Splenomegaly, not elsewhere classified: Secondary | ICD-10-CM | POA: Diagnosis present

## 2016-05-05 DIAGNOSIS — Z91040 Latex allergy status: Secondary | ICD-10-CM | POA: Diagnosis not present

## 2016-05-05 DIAGNOSIS — K219 Gastro-esophageal reflux disease without esophagitis: Secondary | ICD-10-CM | POA: Diagnosis present

## 2016-05-05 DIAGNOSIS — D631 Anemia in chronic kidney disease: Secondary | ICD-10-CM | POA: Diagnosis present

## 2016-05-05 DIAGNOSIS — D649 Anemia, unspecified: Secondary | ICD-10-CM | POA: Diagnosis present

## 2016-05-05 DIAGNOSIS — K589 Irritable bowel syndrome without diarrhea: Secondary | ICD-10-CM | POA: Diagnosis present

## 2016-05-05 DIAGNOSIS — N179 Acute kidney failure, unspecified: Secondary | ICD-10-CM | POA: Diagnosis not present

## 2016-05-05 DIAGNOSIS — Z66 Do not resuscitate: Secondary | ICD-10-CM | POA: Diagnosis present

## 2016-05-05 DIAGNOSIS — N189 Chronic kidney disease, unspecified: Secondary | ICD-10-CM | POA: Diagnosis not present

## 2016-05-05 DIAGNOSIS — Z531 Procedure and treatment not carried out because of patient's decision for reasons of belief and group pressure: Secondary | ICD-10-CM | POA: Diagnosis not present

## 2016-05-05 DIAGNOSIS — R0602 Shortness of breath: Secondary | ICD-10-CM

## 2016-05-05 DIAGNOSIS — I959 Hypotension, unspecified: Secondary | ICD-10-CM | POA: Diagnosis not present

## 2016-05-05 DIAGNOSIS — Z8673 Personal history of transient ischemic attack (TIA), and cerebral infarction without residual deficits: Secondary | ICD-10-CM | POA: Diagnosis not present

## 2016-05-05 DIAGNOSIS — D7589 Other specified diseases of blood and blood-forming organs: Secondary | ICD-10-CM | POA: Diagnosis present

## 2016-05-05 DIAGNOSIS — N183 Chronic kidney disease, stage 3 unspecified: Secondary | ICD-10-CM | POA: Diagnosis present

## 2016-05-05 DIAGNOSIS — D7581 Myelofibrosis: Secondary | ICD-10-CM | POA: Diagnosis not present

## 2016-05-05 DIAGNOSIS — Z7982 Long term (current) use of aspirin: Secondary | ICD-10-CM | POA: Diagnosis not present

## 2016-05-05 DIAGNOSIS — Z87891 Personal history of nicotine dependence: Secondary | ICD-10-CM | POA: Diagnosis not present

## 2016-05-05 DIAGNOSIS — D45 Polycythemia vera: Secondary | ICD-10-CM | POA: Diagnosis not present

## 2016-05-05 DIAGNOSIS — I129 Hypertensive chronic kidney disease with stage 1 through stage 4 chronic kidney disease, or unspecified chronic kidney disease: Secondary | ICD-10-CM | POA: Diagnosis present

## 2016-05-05 DIAGNOSIS — R531 Weakness: Secondary | ICD-10-CM

## 2016-05-05 DIAGNOSIS — F319 Bipolar disorder, unspecified: Secondary | ICD-10-CM | POA: Diagnosis present

## 2016-05-05 DIAGNOSIS — Z79899 Other long term (current) drug therapy: Secondary | ICD-10-CM

## 2016-05-05 DIAGNOSIS — C946 Myelodysplastic disease, not classified: Principal | ICD-10-CM | POA: Diagnosis present

## 2016-05-05 DIAGNOSIS — F419 Anxiety disorder, unspecified: Secondary | ICD-10-CM | POA: Diagnosis present

## 2016-05-05 DIAGNOSIS — D471 Chronic myeloproliferative disease: Secondary | ICD-10-CM | POA: Diagnosis present

## 2016-05-05 LAB — CBC WITH DIFFERENTIAL/PLATELET
BASOS ABS: 0.5 10*3/uL — AB (ref 0.0–0.1)
BASOS PCT: 1 %
EOS ABS: 0.5 10*3/uL (ref 0.0–0.7)
Eosinophils Relative: 1 %
HEMATOCRIT: 17.8 % — AB (ref 39.0–52.0)
HEMOGLOBIN: 5.6 g/dL — AB (ref 13.0–17.0)
LYMPHS PCT: 7 %
Lymphs Abs: 3.5 10*3/uL (ref 0.7–4.0)
MCH: 24.8 pg — ABNORMAL LOW (ref 26.0–34.0)
MCHC: 31.5 g/dL (ref 30.0–36.0)
MCV: 78.8 fL (ref 78.0–100.0)
MONO ABS: 3.5 10*3/uL — AB (ref 0.1–1.0)
Monocytes Relative: 7 %
NEUTROS PCT: 84 %
Neutro Abs: 41.6 10*3/uL — ABNORMAL HIGH (ref 1.7–7.7)
Platelets: 563 10*3/uL — ABNORMAL HIGH (ref 150–400)
RBC: 2.26 MIL/uL — ABNORMAL LOW (ref 4.22–5.81)
RDW: 24.8 % — ABNORMAL HIGH (ref 11.5–15.5)
WBC: 49.6 10*3/uL — ABNORMAL HIGH (ref 4.0–10.5)

## 2016-05-05 LAB — COMPREHENSIVE METABOLIC PANEL
ALBUMIN: 3.9 g/dL (ref 3.5–5.0)
ALK PHOS: 66 U/L (ref 38–126)
ALT: 10 U/L — ABNORMAL LOW (ref 17–63)
AST: 14 U/L — AB (ref 15–41)
Anion gap: 6 (ref 5–15)
BILIRUBIN TOTAL: 0.4 mg/dL (ref 0.3–1.2)
BUN: 46 mg/dL — AB (ref 6–20)
CALCIUM: 8.9 mg/dL (ref 8.9–10.3)
CO2: 20 mmol/L — ABNORMAL LOW (ref 22–32)
Chloride: 111 mmol/L (ref 101–111)
Creatinine, Ser: 1.97 mg/dL — ABNORMAL HIGH (ref 0.61–1.24)
GFR calc Af Amer: 37 mL/min — ABNORMAL LOW (ref >60)
GFR calc non Af Amer: 31 mL/min — ABNORMAL LOW (ref >60)
GLUCOSE: 122 mg/dL — AB (ref 65–99)
Potassium: 4.8 mmol/L (ref 3.5–5.1)
Sodium: 137 mmol/L (ref 135–145)
TOTAL PROTEIN: 8.1 g/dL (ref 6.5–8.1)

## 2016-05-05 LAB — BRAIN NATRIURETIC PEPTIDE: B Natriuretic Peptide: 253.6 pg/mL — ABNORMAL HIGH (ref 0.0–100.0)

## 2016-05-05 LAB — TROPONIN I: Troponin I: <0.03 ng/mL (ref <0.03)

## 2016-05-05 MED ORDER — SODIUM CHLORIDE 0.9 % IV BOLUS (SEPSIS)
1000.0000 mL | Freq: Once | INTRAVENOUS | Status: AC
  Administered 2016-05-05: 1000 mL via INTRAVENOUS

## 2016-05-05 MED ORDER — SODIUM CHLORIDE 0.9 % IV SOLN: 10.0000 mL/h | Freq: Once | INTRAVENOUS | Status: DC

## 2016-05-05 MED ORDER — SODIUM CHLORIDE 0.9 % IV SOLN
INTRAVENOUS | Status: DC
  Administered 2016-05-05: 23:00:00 via INTRAVENOUS

## 2016-05-05 MED ORDER — IOPAMIDOL (ISOVUE-370) INJECTION 76%
80.0000 mL | Freq: Once | INTRAVENOUS | Status: AC | PRN
  Administered 2016-05-05: 23:00:00 via INTRAVENOUS

## 2016-05-05 MED ORDER — IOPAMIDOL (ISOVUE-370) INJECTION 76%: 80.0000 mL | Freq: Once | INTRAVENOUS | Status: DC | PRN

## 2016-05-05 MED ORDER — SODIUM CHLORIDE 0.9 % IV SOLN: INTRAVENOUS | Status: DC

## 2016-05-05 MED ORDER — SODIUM CHLORIDE 0.9 % IV BOLUS (SEPSIS): 250.0000 mL | Freq: Once | INTRAVENOUS | Status: DC

## 2016-05-05 MED ORDER — LORAZEPAM 2 MG/ML IJ SOLN
0.5000 mg | Freq: Once | INTRAMUSCULAR | Status: AC
  Administered 2016-05-05: 0.5 mg via INTRAVENOUS
  Filled 2016-05-05: qty 1

## 2016-05-05 NOTE — ED Notes (Signed)
Pt spoke to this nurse and stated he refuses blood transfusions.  Made MD aware.  MD at bedside

## 2016-05-05 NOTE — ED Notes (Signed)
This nurse spoke with wife on the telephone in regards to patient's wishes of refusing blood transfusion with patient consent.  Wife states the patient is alert and oriented and aware of danger of refusing blood transfusion.  States she is supportive in his decision. Made MD aware.

## 2016-05-05 NOTE — ED Provider Notes (Signed)
[facility] DEPT Provider Note   CSN: [id number] Arrival date & time: 05/05/16  1936     History   Chief Complaint Chief Complaint  Patient presents with  . Shortness of Breath    HPI John [name] is a 75 y.o. male.  75 year old male presents with 2 days of shortness of breath. Note some dyspnea on exertion but denies any orthopnea. No cough or congestion. No recent history of blood loss. Assessment lower extremity edema but denies any unilateral swelling. History of similar symptoms a month ago but did not have any evaluation for this. Denies abdominal discomfort. Denies any anginal type chest pain. Has had some associated blurred vision but denies any focal weakness or severe headache.      Past Medical History:  Diagnosis Date  . Anxiety states   . Arthritis   . Bipolar disorder [facility])    sees Dr. Tyrell Antonio in [place]  . Colon polyps   . Depression   . Diverticulitis    history of  . Diverticulosis of colon   . Gastritis   . GERD (gastroesophageal reflux disease)   . H/O: CVA (cardiovascular accident)    has had two so far  . Hemorrhoids, external   . Hemorrhoids, internal   . HTN (hypertension)   . IBS (irritable bowel syndrome)    see Dr. Carlean Purl  . Pelvic floor dysfunction   . 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 Thrombocythemia, essential ([place])    sees Dr. Valinda Party at [facility]   . Tubular adenoma 05/08/2009    Patient Active Problem List   Diagnosis Date Noted  . Anemia in chronic kidney disease 10/26/2015  . Anemia of chronic disease 08/17/2015  . Splenomegaly 08/17/2015  . Myelodysplastic syndrome ([place]) 08/16/2015  . Abdominal pain 08/16/2015  . Chronic diarrhea 08/16/2015  . Fecal incontinence due to anorectal disorder 07/31/2015  . Myeloproliferative disorder ([place]) 12/02/2013  . Polycythemia vera ([place]) 10/11/2012  . Refusal of blood transfusions as patient is Jehovah's Witness 05/22/2012  . Generalized weakness  05/22/2012  . Frequent falls 05/22/2012  . Hypotension 05/22/2012  . Pelvic floor dysfunction 12/18/2010  . BIPOLAR AFFECTIVE DISORDER 05/22/2010  . ANXIETY 04/26/2009  . HEMORRHOIDS-EXTERNAL 04/26/2009  . Irritable bowel syndrome 03/15/2009  . CEREBROVASCULAR ACCIDENT, HX OF 12/25/2008  . Reflex sympathetic dystrophy of upper extremity 09/27/2007  . HYPERTENSION 05/05/2007  . GERD 05/05/2007    Past Surgical History:  Procedure Laterality Date  . APPENDECTOMY    . CARPAL TUNNEL RELEASE     left arm  . COLONOSCOPY  05/08/2009   adenoma, severe sigmid diverticulosis, int hemorrhoids, NL  random colon bxs and terminal ileum  . ESOPHAGOGASTRODUODENOSCOPY  10/04/2002   gastritis  . left elbow surgery x 2     x 2  . SPINE SURGERY    . TONSILLECTOMY         Home Medications    Prior to Admission medications   Medication Sig Start Date End Date Taking? Authorizing Provider  aspirin (GOODSENSE ASPIRIN) 325 MG tablet Take 325 mg by mouth.    Historical Provider, MD  divalproex (DEPAKOTE SPRINKLE) 125 MG capsule Take 125 mg by mouth 4 (four) times daily.     Historical Provider, MD  doxylamine, Sleep, (UNISOM) 25 MG tablet Take 25 mg by mouth at bedtime as needed for sleep.    Historical Provider, MD  hydrOXYzine (ATARAX/VISTARIL) 10 MG tablet Take 1 tablet (10 mg total) by  mouth 3 (three) times daily as needed. 05/01/16   Wyatt Portela, MD  Multiple Vitamins-Minerals (CENTRUM ADULTS PO) Take 1 tablet by mouth.    Historical Provider, MD  oxyCODONE (OXY IR/ROXICODONE) 5 MG immediate release tablet Take 1 tablet (5 mg total) by mouth every 4 (four) hours as needed for moderate pain. 08/17/15   Nishant Dhungel, MD  risperiDONE (RISPERDAL) 1 MG tablet Take 1 mg by mouth daily.     Historical Provider, MD  sertraline (ZOLOFT) 100 MG tablet Take 150 mg by mouth every evening.     Historical Provider, MD    Family History Family History  Problem Relation Age of Onset  . Alcohol abuse  Father   . Ovarian cancer Mother   . Celiac disease Brother   . Colon cancer Neg Hx     Social History Social History  Substance Use Topics  . Smoking status: Former Research scientist (life sciences)  . Smokeless tobacco: Never Used  . Alcohol use No     Allergies   Latex   Review of Systems Review of Systems  All other systems reviewed and are negative.    Physical Exam Updated Vital Signs BP (!) 84/63 (BP Location: Right Arm)   Pulse 106   Temp 98 F (36.7 C) (Oral)   Resp 19   SpO2 95%   Physical Exam  Constitutional: He is oriented to person, place, and time. He appears well-developed and well-nourished.  Non-toxic appearance. No distress.  HENT:  Head: Normocephalic and atraumatic.  Eyes: Conjunctivae, EOM and lids are normal. Pupils are equal, round, and reactive to light.  Neck: Normal range of motion. Neck supple. No tracheal deviation present. No thyroid mass present.  Cardiovascular: Normal rate, regular rhythm and normal heart sounds.  Exam reveals no gallop.   No murmur heard. Pulmonary/Chest: Effort normal and breath sounds normal. No stridor. No respiratory distress. He has no decreased breath sounds. He has no wheezes. He has no rhonchi. He has no rales.  Abdominal: Soft. Normal appearance and bowel sounds are normal. He exhibits no distension. There is no tenderness. There is no rebound and no CVA tenderness.  Musculoskeletal: Normal range of motion. He exhibits no edema or tenderness.  Neurological: He is alert and oriented to person, place, and time. He has normal strength. No cranial nerve deficit or sensory deficit. GCS eye subscore is 4. GCS verbal subscore is 5. GCS motor subscore is 6.  Skin: Skin is warm and dry. No abrasion and no rash noted.  Psychiatric: His speech is normal and behavior is normal. His affect is blunt.  Nursing note and vitals reviewed.    ED Treatments / Results  Labs (all labs ordered are listed, but only abnormal results are displayed) Labs  Reviewed  CBC WITH DIFFERENTIAL/PLATELET  COMPREHENSIVE METABOLIC PANEL  BRAIN NATRIURETIC PEPTIDE  TROPONIN I    EKG  EKG Interpretation None       Radiology No results found.  Procedures Procedures (including critical care time)  Medications Ordered in ED Medications  0.9 %  sodium chloride infusion (not administered)  sodium chloride 0.9 % bolus 250 mL (not administered)  0.9 %  sodium chloride infusion (not administered)     Initial Impression / Assessment and Plan / ED Course  I have reviewed the triage vital signs and the nursing notes.  Pertinent labs & imaging results that were available during my care of the patient were reviewed by me and considered in my medical decision making (see chart  for details).  Clinical Course   CRITICAL CARE Performed by: Leota Jacobsen Total critical care time: 75 minutes Critical care time was exclusive of separately billable procedures and treating other patients. Critical care was necessary to treat or prevent imminent or life-threatening deterioration. Critical care was time spent personally by me on the following activities: development of treatment plan with patient and/or surrogate as well as nursing, discussions with consultants, evaluation of patient's response to treatment, examination of patient, obtaining history from patient or surrogate, ordering and performing treatments and interventions, ordering and review of laboratory studies, ordering and review of radiographic studies, pulse oximetry and re-evaluation of patient's condition.  Patient's hypotension treated with IV fluids and blood pressure did improve. Patient does have a history of a myeloproliferative disorder and hemoglobin is low. Patient refuses to have a blood transfusion at this time. He understands the risk of this. Does have some evidence of acute kidney injury. Will be admitted for hydration.  Final Clinical Impressions(s) / ED Diagnoses   Final  diagnoses:  SOB (shortness of breath)    New Prescriptions New Prescriptions   No medications on file     Lacretia Leigh, MD 05/05/16 2320

## 2016-05-05 NOTE — ED Notes (Signed)
Wife's contact information: Roxanna Mew [phone]

## 2016-05-05 NOTE — ED Triage Notes (Signed)
Pt is c/o shortness of breath that started tonight  Pt states he had same about a month ago but it resolved  Pt states he has been fine until this evening  Pt is pale  Pt states he had an episode of blurred vision earlier today and is having discomfort in his neck and left shoulder

## 2016-05-06 ENCOUNTER — Telehealth: Payer: Self-pay | Admitting: Family Medicine

## 2016-05-06 ENCOUNTER — Encounter (HOSPITAL_COMMUNITY): Payer: Self-pay | Admitting: Family Medicine

## 2016-05-06 DIAGNOSIS — N183 Chronic kidney disease, stage 3 unspecified: Secondary | ICD-10-CM | POA: Diagnosis present

## 2016-05-06 DIAGNOSIS — D7581 Myelofibrosis: Secondary | ICD-10-CM | POA: Diagnosis present

## 2016-05-06 DIAGNOSIS — N189 Chronic kidney disease, unspecified: Secondary | ICD-10-CM

## 2016-05-06 DIAGNOSIS — D631 Anemia in chronic kidney disease: Secondary | ICD-10-CM

## 2016-05-06 DIAGNOSIS — R531 Weakness: Secondary | ICD-10-CM

## 2016-05-06 DIAGNOSIS — D649 Anemia, unspecified: Secondary | ICD-10-CM

## 2016-05-06 DIAGNOSIS — K219 Gastro-esophageal reflux disease without esophagitis: Secondary | ICD-10-CM

## 2016-05-06 DIAGNOSIS — F319 Bipolar disorder, unspecified: Secondary | ICD-10-CM

## 2016-05-06 DIAGNOSIS — Z531 Procedure and treatment not carried out because of patient's decision for reasons of belief and group pressure: Secondary | ICD-10-CM

## 2016-05-06 DIAGNOSIS — N179 Acute kidney failure, unspecified: Secondary | ICD-10-CM | POA: Diagnosis present

## 2016-05-06 DIAGNOSIS — D471 Chronic myeloproliferative disease: Secondary | ICD-10-CM

## 2016-05-06 LAB — URINALYSIS, ROUTINE W REFLEX MICROSCOPIC
Bilirubin Urine: NEGATIVE
GLUCOSE, UA: NEGATIVE mg/dL
Hgb urine dipstick: NEGATIVE
Ketones, ur: NEGATIVE mg/dL
LEUKOCYTES UA: NEGATIVE
Nitrite: NEGATIVE
PH: 5 (ref 5.0–8.0)
Protein, ur: NEGATIVE mg/dL
SPECIFIC GRAVITY, URINE: 1.026 (ref 1.005–1.030)

## 2016-05-06 LAB — BASIC METABOLIC PANEL
Anion gap: 5 (ref 5–15)
BUN: 40 mg/dL — AB (ref 6–20)
CHLORIDE: 114 mmol/L — AB (ref 101–111)
CO2: 18 mmol/L — ABNORMAL LOW (ref 22–32)
CREATININE: 1.78 mg/dL — AB (ref 0.61–1.24)
Calcium: 8.1 mg/dL — ABNORMAL LOW (ref 8.9–10.3)
GFR, EST AFRICAN AMERICAN: 41 mL/min — AB (ref >60)
GFR, EST NON AFRICAN AMERICAN: 36 mL/min — AB (ref >60)
Glucose, Bld: 94 mg/dL (ref 65–99)
POTASSIUM: 4.6 mmol/L (ref 3.5–5.1)
SODIUM: 137 mmol/L (ref 135–145)

## 2016-05-06 LAB — APTT: aPTT: 37 seconds — ABNORMAL HIGH (ref 24–36)

## 2016-05-06 LAB — CBC
HEMATOCRIT: 15.8 % — AB (ref 39.0–52.0)
Hemoglobin: 5.2 g/dL — CL (ref 13.0–17.0)
MCH: 26 pg (ref 26.0–34.0)
MCHC: 32.9 g/dL (ref 30.0–36.0)
MCV: 79 fL (ref 78.0–100.0)
Platelets: 494 10*3/uL — ABNORMAL HIGH (ref 150–400)
RBC: 2 MIL/uL — ABNORMAL LOW (ref 4.22–5.81)
RDW: 24.8 % — ABNORMAL HIGH (ref 11.5–15.5)
WBC: 45.1 10*3/uL — ABNORMAL HIGH (ref 4.0–10.5)

## 2016-05-06 LAB — LACTIC ACID, PLASMA
LACTIC ACID, VENOUS: 0.4 mmol/L — AB (ref 0.5–1.9)
Lactic Acid, Venous: 0.4 mmol/L — ABNORMAL LOW (ref 0.5–1.9)

## 2016-05-06 LAB — NO BLOOD PRODUCTS

## 2016-05-06 LAB — PROTIME-INR
INR: 1.33
PROTHROMBIN TIME: 16.6 s — AB (ref 11.4–15.2)

## 2016-05-06 LAB — GLUCOSE, CAPILLARY: GLUCOSE-CAPILLARY: 114 mg/dL — AB (ref 65–99)

## 2016-05-06 MED ORDER — ENSURE ENLIVE PO LIQD: 237.0000 mL | ORAL | Status: DC

## 2016-05-06 MED ORDER — VITAMIN B-12 100 MCG PO TABS
100.0000 ug | ORAL_TABLET | Freq: Every day | ORAL | Status: DC
  Administered 2016-05-06: 100 ug via ORAL
  Filled 2016-05-06: qty 1

## 2016-05-06 MED ORDER — FAMOTIDINE IN NACL 20-0.9 MG/50ML-% IV SOLN: 20.0000 mg | Freq: Two times a day (BID) | INTRAVENOUS | Status: DC

## 2016-05-06 MED ORDER — ONDANSETRON HCL 4 MG/2ML IJ SOLN: 4.0000 mg | Freq: Four times a day (QID) | INTRAMUSCULAR | Status: DC | PRN

## 2016-05-06 MED ORDER — ENSURE ENLIVE PO LIQD
237.0000 mL | Freq: Two times a day (BID) | ORAL | Status: DC
  Administered 2016-05-06: 237 mL via ORAL

## 2016-05-06 MED ORDER — RISPERIDONE 1 MG PO TABS
1.0000 mg | ORAL_TABLET | Freq: Every day | ORAL | Status: DC
Start: 2016-05-06 — End: 2016-05-06
  Administered 2016-05-06: 1 mg via ORAL
  Filled 2016-05-06: qty 1

## 2016-05-06 MED ORDER — OMEPRAZOLE 20 MG PO CPDR: 20.0000 mg | DELAYED_RELEASE_CAPSULE | Freq: Every day | ORAL | 0 refills | Status: AC

## 2016-05-06 MED ORDER — ONDANSETRON HCL 4 MG PO TABS: 4.0000 mg | ORAL_TABLET | Freq: Four times a day (QID) | ORAL | Status: DC | PRN

## 2016-05-06 MED ORDER — SODIUM CHLORIDE 0.9% FLUSH: 3.0000 mL | Freq: Two times a day (BID) | INTRAVENOUS | Status: DC

## 2016-05-06 MED ORDER — DOXYLAMINE SUCCINATE (SLEEP) 25 MG PO TABS
25.0000 mg | ORAL_TABLET | Freq: Every evening | ORAL | Status: DC | PRN
  Filled 2016-05-06: qty 1

## 2016-05-06 MED ORDER — PROMETHAZINE HCL 25 MG PO TABS: 25.0000 mg | ORAL_TABLET | Freq: Four times a day (QID) | ORAL | 0 refills | Status: AC | PRN

## 2016-05-06 MED ORDER — ACETAMINOPHEN 650 MG RE SUPP: 650.0000 mg | Freq: Four times a day (QID) | RECTAL | Status: DC | PRN

## 2016-05-06 MED ORDER — SENNA 8.6 MG PO TABS: 2.0000 | ORAL_TABLET | Freq: Every day | ORAL | 0 refills | Status: AC

## 2016-05-06 MED ORDER — ORAL CARE MOUTH RINSE
15.0000 mL | Freq: Two times a day (BID) | OROMUCOSAL | Status: DC
  Administered 2016-05-06 (×2): 15 mL via OROMUCOSAL

## 2016-05-06 MED ORDER — DIVALPROEX SODIUM 125 MG PO CSDR
125.0000 mg | DELAYED_RELEASE_CAPSULE | Freq: Three times a day (TID) | ORAL | Status: DC
  Administered 2016-05-06 (×2): 125 mg via ORAL
  Filled 2016-05-06 (×3): qty 1

## 2016-05-06 MED ORDER — POLYETHYLENE GLYCOL 3350 17 G PO PACK
17.0000 g | PACK | Freq: Every day | ORAL | Status: DC
  Filled 2016-05-06: qty 1

## 2016-05-06 MED ORDER — PANTOPRAZOLE SODIUM 40 MG PO TBEC
40.0000 mg | DELAYED_RELEASE_TABLET | Freq: Every day | ORAL | Status: DC
  Administered 2016-05-06 (×2): 40 mg via ORAL
  Filled 2016-05-06 (×2): qty 1

## 2016-05-06 MED ORDER — SERTRALINE HCL 50 MG PO TABS
150.0000 mg | ORAL_TABLET | Freq: Every evening | ORAL | Status: DC
  Administered 2016-05-06: 150 mg via ORAL
  Filled 2016-05-06: qty 3

## 2016-05-06 MED ORDER — ACETAMINOPHEN 325 MG PO TABS: 650.0000 mg | ORAL_TABLET | Freq: Four times a day (QID) | ORAL | Status: DC | PRN

## 2016-05-06 MED ORDER — POLYETHYLENE GLYCOL 3350 17 G PO PACK: 17.0000 g | PACK | Freq: Every day | ORAL | 0 refills | Status: AC

## 2016-05-06 MED ORDER — SENNA 8.6 MG PO TABS: 2.0000 | ORAL_TABLET | Freq: Every day | ORAL | Status: DC

## 2016-05-06 MED ORDER — HYDROXYZINE HCL 10 MG PO TABS
10.0000 mg | ORAL_TABLET | Freq: Three times a day (TID) | ORAL | Status: DC | PRN
  Administered 2016-05-06: 10 mg via ORAL
  Filled 2016-05-06 (×2): qty 1

## 2016-05-06 MED ORDER — MORPHINE SULFATE (CONCENTRATE) 10 MG /0.5 ML PO SOLN: 5.0000 mg | ORAL | 0 refills | Status: AC | PRN

## 2016-05-06 MED ORDER — DARBEPOETIN ALFA 300 MCG/0.6ML IJ SOSY
300.0000 ug | PREFILLED_SYRINGE | Freq: Once | INTRAMUSCULAR | Status: AC
  Administered 2016-05-06: 300 ug via SUBCUTANEOUS
  Filled 2016-05-06: qty 0.6

## 2016-05-06 MED ORDER — OXYCODONE HCL 5 MG PO TABS: 5.0000 mg | ORAL_TABLET | ORAL | Status: DC | PRN

## 2016-05-06 MED ORDER — BISACODYL 10 MG RE SUPP
20.0000 mg | Freq: Once | RECTAL | Status: DC
  Filled 2016-05-06: qty 2

## 2016-05-06 MED ORDER — ALUM & MAG HYDROXIDE-SIMETH 200-200-20 MG/5ML PO SUSP
15.0000 mL | Freq: Once | ORAL | Status: AC
  Administered 2016-05-06: 15 mL via ORAL
  Filled 2016-05-06: qty 30

## 2016-05-06 MED ORDER — SODIUM CHLORIDE 0.9 % IV SOLN
INTRAVENOUS | Status: DC
  Administered 2016-05-06: 01:00:00 via INTRAVENOUS

## 2016-05-06 MED ORDER — SODIUM CHLORIDE 0.9 % IV BOLUS (SEPSIS)
500.0000 mL | Freq: Once | INTRAVENOUS | Status: AC
  Administered 2016-05-06: 500 mL via INTRAVENOUS

## 2016-05-06 MED ORDER — POLYVINYL ALCOHOL 1.4 % OP SOLN
1.0000 [drp] | OPHTHALMIC | Status: DC | PRN
  Administered 2016-05-06: 1 [drp] via OPHTHALMIC
  Filled 2016-05-06: qty 15

## 2016-05-06 NOTE — Progress Notes (Signed)
MEDICATION RELATED CONSULT NOTE - INITIAL   Pharmacy Consult for Aranesp Indication: myeloproliferative disorder  Allergies  Allergen Reactions  . Latex     REACTION: rash    Patient Measurements: Height: 5\' 10"  (177.8 cm) Weight: 177 lb 0.5 oz (80.3 kg) IBW/kg (Calculated) : 73  Vital Signs: Temp: 97.6 F (36.4 C) (09/26 0519) Temp Source: Oral (09/26 0519) BP: 95/62 (09/26 0519) Pulse Rate: 78 (09/26 0519) Intake/Output from previous day: 09/25 0701 - 09/26 0700 In: 2910.8 [P.O.:240; I.V.:670.8; IV Piggyback:2000] Out: 900 [Urine:900] Intake/Output from this shift: No intake/output data recorded.  Labs:  Recent Labs  05/05/16 2053 05/06/16 0058  WBC 49.6* 45.1*  HGB 5.6* 5.2*  HCT 17.8* 15.8*  PLT 563* 494*  APTT  --  37*  CREATININE 1.97* 1.78*  ALBUMIN 3.9  --   PROT 8.1  --   AST 14*  --   ALT 10*  --   ALKPHOS 66  --   BILITOT 0.4  --    Estimated Creatinine Clearance: 37 mL/min (by C-G formula based on SCr of 1.78 mg/dL (H)).   Medical History: Past Medical History:  Diagnosis Date  . Anxiety states   . Arthritis   . Bipolar disorder [facility])    sees Dr. Tyrell Antonio in [place]  . Colon polyps   . Depression   . Diverticulitis    history of  . Diverticulosis of colon   . Gastritis   . GERD (gastroesophageal reflux disease)   . H/O: CVA (cardiovascular accident)    has had two so far  . Hemorrhoids, external   . Hemorrhoids, internal   . HTN (hypertension)   . IBS (irritable bowel syndrome)    see Dr. Carlean Purl  . Pelvic floor dysfunction   . 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 Thrombocythemia, essential ([place])    sees Dr. Valinda Party at [facility]   . Tubular adenoma 05/08/2009    Medications:  Scheduled:  . divalproex  125 mg Oral TID  . [START ON 05/07/2016] famotidine (PEPCID) IV  20 mg Intravenous Q12H  . feeding supplement (ENSURE ENLIVE)  237 mL Oral BID BM  . mouth rinse  15 mL Mouth Rinse BID  .  pantoprazole  40 mg Oral Daily  . risperiDONE  1 mg Oral Daily  . sertraline  150 mg Oral QPM  . sodium chloride flush  3 mL Intravenous Q12H  . vitamin B-12  100 mcg Oral Daily   Infusions:  . sodium chloride 125 mL/hr at 05/06/16 0101    Assessment: 75 y.o.M presents to ED on 9/25 with worsening dyspnea, generalized weakness, tachycardia, and hypotension.  PMH significant for bipolar disorder, GERD, and myeloproliferative disorder managed by Dr. Alen Blew.  He was getting Aranesp 300 q3 weeks, last given on 8/10, but currently on break.  Pharmacy is consulted to dose Aranesp inpatient.   Most recent iron panel (02/07/16):  Iron, UIBC, TIBC, %sat, and ferritin WNL.  SCr 1.78  Hgb decreased from 5.6 to 5.2  PRBC transfusion refused d/t Jehovah's Witness faith  Goal of Therapy:  Hgb > 10 g/dL  Plan:   Aranesp 300 mcg SQ once today.  F/u heme/onc for repeat doses.  Labs per MD, planning for discharge with Hospice today.  Gretta Arab PharmD, BCPS Pager [phone] 05/06/2016 7:39 AM

## 2016-05-06 NOTE — Telephone Encounter (Signed)
Yes, per Dr. Sarajane Jews and I spoke with [facility] at [facility] and gave this information.

## 2016-05-06 NOTE — ED Notes (Signed)
Blood refusal sent to lab.

## 2016-05-06 NOTE — Telephone Encounter (Signed)
Amber with hosp[facility] and [facility] of [facility] states she has orders for pt (dc'd from hospital today) and wants to make sure Dr Sarajane Jews is the attending physician. Please call back.

## 2016-05-06 NOTE — Discharge Summary (Signed)
Physician Discharge Summary  John [name] [id number] DOB: 1941/01/21 DOA: 05/05/2016  PCP: Laurey Morale, MD  Admit date: 05/05/2016 Discharge date: 05/06/2016  Admitted From: Home  Disposition:  Home with hospice  Recommendations for Outpatient Follow-up:  1. Follow up with Dr. Alen Blew prn  Home Hospice   Equipment/Devices:  Oxygen  Discharge Condition:  Stable, improved CODE STATUS:  DNR  Diet recommendation:  regular   Brief/Interim Summary:  John [name] is a 75 y.o. male with medical history significant for bipolar disorder, GERD, and myeloproliferative disorder who presented the emergency department with worsening dyspnea and generalized weakness. He denied melena or hematochezia. Patient has been under the care of hematology and oncology for management of his myeloproliferative disorder. His treatment had been complicated by intolerance of hydroxyurea and significant anemia and until a month ago he had been taking Aranesp injections.  A month ago, he decided to no longer take the Aranesp.  He was hypotensive to 78/50 and his hemoglobin was 5.6 mg/dl.  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  He was given a dose of Aranesp but did not want any further injections.  In discussion with him and his family, he has elected home with hospice and DNR/DNI.  Given the progression of his anemia, his prognosis is less than 6 months and more likely less than 2 months as he is no longer taking aranesp.  Case was also discussed with Dr. Alen Blew who was in agreement with this prognosis.  He would like to go home with hospice but if he becomes more SOB or weaker, he would be amenable to residential hospice.    Discharge Diagnoses:  Principal Problem:   Symptomatic anemia Active Problems:   BIPOLAR AFFECTIVE DISORDER   GERD   Refusal of blood transfusions as patient is Jehovah's Witness   Generalized weakness   Myeloproliferative disorder (HCC)   Anemia in chronic  kidney disease   Myelofibrosis (HCC)   AKI (acute kidney injury) ([place])   CKD (chronic kidney disease), stage III   Symptomatic anemia (SOB most concerning to him) due to progressive myeloproliferative disorder.   - Hgb 5.6 on admission and trended down to 5.2 - Declined blood products due to his wife being a Jehovah's Witness - Supportive care with supplemental oxygen and morphine  -  Hospice care  Myeloproliferative disorder with myelofibrosis  - Followed by Dr. Alen Blew of heme/onc  - Hx of hydroxyurea intolerance; other treatment modalities have been precluded by significant anemia   AKI superimposed on CKD stage III, Likely secondary to anemia and hypotension.  Creatinine trended down slightly with IVF - Avoid nephrotoxins   GERD - Gastritis only on EGD in 2004 and indefinite PPI tx advised -  Recommend prn pepcid  Bipolar disorder  - Appears to be stable; pt denies SI, HI, or hallucination at time of admission - Continue Risperdal, Depakote, Zoloft   Discharge Instructions  Discharge Instructions    Diet general    Complete by:  As directed    Discharge instructions    Complete by:  As directed    If you have questions or concerns, please call the hospice hotline.   Increase activity slowly    Complete by:  As directed        Medication List    STOP taking these medications   GOODSENSE ASPIRIN 325 MG tablet Generic drug:  aspirin   oxyCODONE 5 MG immediate release tablet Commonly known as:  Oxy IR/ROXICODONE  TAKE these medications   divalproex 125 MG capsule Commonly known as:  DEPAKOTE SPRINKLE Take 125 mg by mouth 3 (three) times daily.   doxylamine (Sleep) 25 MG tablet Commonly known as:  UNISOM Take 25 mg by mouth at bedtime as needed for sleep.   hydrOXYzine 10 MG tablet Commonly known as:  ATARAX/VISTARIL Take 1 tablet (10 mg total) by mouth 3 (three) times daily as needed. What changed:  reasons to take this   morphine CONCENTRATE 10  mg / 0.5 ml concentrated solution Take 0.25 mLs (5 mg total) by mouth every hour as needed for moderate pain, severe pain or shortness of breath.   omeprazole 20 MG capsule Commonly known as:  PRILOSEC Take 1 capsule (20 mg total) by mouth daily.   polyethylene glycol packet Commonly known as:  MIRALAX / GLYCOLAX Take 17 g by mouth daily. Start taking on:  05/07/2016   promethazine 25 MG tablet Commonly known as:  PHENERGAN Take 1 tablet (25 mg total) by mouth every 6 (six) hours as needed for nausea or vomiting.   RISPERDAL 1 MG tablet Generic drug:  risperiDONE Take 1 mg by mouth daily.   senna 8.6 MG Tabs tablet Commonly known as:  SENOKOT Take 2 tablets (17.2 mg total) by mouth at bedtime.   sertraline 100 MG tablet Commonly known as:  ZOLOFT Take 150 mg by mouth every evening.   vitamin B-12 100 MCG tablet Commonly known as:  CYANOCOBALAMIN Take 100 mcg by mouth daily.      Follow-up Information    Laurey Morale, MD .   Specialty:  Family Medicine Contact information: [place] [address] [phone]        [facility], MD .   Specialty:  Oncology Why:  as needed Contact information: [place]. [place] [address] [phone]          Allergies  Allergen Reactions  . Latex     REACTION: rash    Consultations: none   Procedures/Studies: Ct Angio Chest Pe W Or Wo Contrast  Result Date: 05/06/2016 CLINICAL DATA:  75 year old male with shortness of breath EXAM: CT ANGIOGRAPHY CHEST WITH CONTRAST TECHNIQUE: Multidetector CT imaging of the chest was performed using the standard protocol during bolus administration of intravenous contrast. Multiplanar CT image reconstructions and MIPs were obtained to evaluate the vascular anatomy. CONTRAST:  80 cc Isovue 370 COMPARISON:  Chest radiograph dated 05/05/2016 FINDINGS: Evaluation of this exam is limited due to respiratory motion artifact. There is mild diffuse ground-glass  density throughout the lungs with interlobular septal prominence likely representing mild interstitial edema. There is no focal consolidation. No pleural effusion or pneumothorax noted. Small scattered pneumatoceles. The central airways are patent. There is atherosclerotic calcification of the thoracic aorta. No aneurysmal dilatation or evidence of dissection. The origins of the great vessels of the aortic arch appear patent. Evaluation of the pulmonary arteries is limited due to respiratory motion artifact and suboptimal opacification of the peripheral branches. No definite central pulmonary artery embolus identified. There is mild cardiomegaly. No pericardial effusion. There is coronary vascular calcification. There is no hilar or mediastinal adenopathy. The esophagus is grossly unremarkable. There is no axillary adenopathy. The chest wall soft tissues appear unremarkable. There is degenerative changes of the spine. No acute fracture. There is splenomegaly measuring up to 19 cm of indeterminate etiology. Clinical correlation is recommended. The visualized upper abdomen is otherwise unremarkable. IMPRESSION: No CT evidence of central pulmonary artery embolus. Cardiomegaly with  mild interstitial edema.  No focal consolidation. Splenomegaly. Electronically Signed   By: Anner Crete M.D.   On: 05/06/2016 00:09   Dg Chest Port 1 View  Result Date: 05/05/2016 CLINICAL DATA:  Maram Bently of breath EXAM: PORTABLE CHEST 1 VIEW COMPARISON:  03/26/2010 FINDINGS: The heart size and mediastinal contours are within normal limits. Both lungs are clear. The visualized skeletal structures are unremarkable. IMPRESSION: No active disease. Electronically Signed   By: Franchot Gallo M.D.   On: 05/05/2016 21:45   Subjective: Still weak and Deitra Craine of breath but better with oxygen.  Had a BM.  Voiding okay.   Discharge Exam: Vitals:   05/06/16 0519 05/06/16 0800  BP: 95/62 (!) 93/52  Pulse: 78 81  Resp: 16   Temp: 97.6 F  (36.4 C) 98.1 F (36.7 C)   Vitals:   05/05/16 2345 05/06/16 0028 05/06/16 0519 05/06/16 0800  BP: (!) 91/52 (!) 102/59 95/62 (!) 93/52  Pulse:  86 78 81  Resp:  16 16   Temp:  98 F (36.7 C) 97.6 F (36.4 C) 98.1 F (36.7 C)  TempSrc:  Oral Oral   SpO2:  100% 100%   Weight:  80.1 kg (176 lb 9.4 oz) 80.3 kg (177 lb 0.5 oz)   Height:  5\' 10"  (1.778 m)      General: Extreme pallor, mild SCM retractions and tachypnea, nasal canula in place Cardiovascular: RRR, S1/S2 +, no rubs, no gallops Respiratory: CTA bilaterally, no wheezing, no rhonchi Abdominal: Soft, NT, ND, bowel sounds + Extremities: no edema, no cyanosis   The results of significant diagnostics from this hospitalization (including imaging, microbiology, ancillary and laboratory) are listed below for reference.     Microbiology: No results found for this or any previous visit (from the past 240 hour(s)).   Labs: BNP (last 3 results)  Recent Labs  05/05/16 2053  BNP [id number]*   Basic Metabolic Panel:  Recent Labs Lab 05/05/16 2053 05/06/16 0058  NA 137 137  K 4.8 4.6  CL 111 114*  CO2 20* 18*  GLUCOSE 122* 94  BUN 46* 40*  CREATININE 1.97* 1.78*  CALCIUM 8.9 8.1*   Liver Function Tests:  Recent Labs Lab 05/05/16 2053  AST 14*  ALT 10*  ALKPHOS 66  BILITOT 0.4  PROT 8.1  ALBUMIN 3.9   No results for input(s): LIPASE, AMYLASE in the last 168 hours. No results for input(s): AMMONIA in the last 168 hours. CBC:  Recent Labs Lab 05/05/16 2053 05/06/16 0058  WBC 49.6* 45.1*  NEUTROABS 41.6*  --   HGB 5.6* 5.2*  HCT 17.8* 15.8*  MCV 78.8 79.0  PLT 563* 494*   Cardiac Enzymes:  Recent Labs Lab 05/05/16 2053  TROPONINI <0.03   BNP: Invalid input(s): POCBNP CBG:  Recent Labs Lab 05/06/16 0726  GLUCAP 114*   D-Dimer No results for input(s): DDIMER in the last 72 hours. Hgb A1c No results for input(s): HGBA1C in the last 72 hours. Lipid Profile No results for input(s): CHOL,  HDL, LDLCALC, TRIG, CHOLHDL, LDLDIRECT in the last 72 hours. Thyroid function studies No results for input(s): TSH, T4TOTAL, T3FREE, THYROIDAB in the last 72 hours.  Invalid input(s): FREET3 Anemia work up No results for input(s): VITAMINB12, FOLATE, FERRITIN, TIBC, IRON, RETICCTPCT in the last 72 hours. Urinalysis    Component Value Date/Time   COLORURINE YELLOW 05/06/2016 0230   APPEARANCEUR CLEAR 05/06/2016 0230   LABSPEC 1.026 05/06/2016 0230   PHURINE 5.0 05/06/2016 0230  GLUCOSEU NEGATIVE 05/06/2016 0230   HGBUR NEGATIVE 05/06/2016 0230   BILIRUBINUR NEGATIVE 05/06/2016 0230   KETONESUR NEGATIVE 05/06/2016 0230   PROTEINUR NEGATIVE 05/06/2016 0230   UROBILINOGEN 1.0 08/07/2010 1514   NITRITE NEGATIVE 05/06/2016 0230   LEUKOCYTESUR NEGATIVE 05/06/2016 0230   Sepsis Labs Invalid input(s): PROCALCITONIN,  WBC,  LACTICIDVEN   Time coordinating discharge: Over 30 minutes  SIGNED:   Janece Canterbury, MD  [facility] 05/06/2016, 1:54 PM Pager   If 7PM-7AM, please contact night-coverage [email or web address] Password [email or web address]

## 2016-05-06 NOTE — Care Management Note (Signed)
Case Management Note  Patient Details  Name: John [name] MRN: [id number] Date of Birth: 04-23-1941  Subjective/Objective:[facility] accepted referral. [facility] liason Collie Siad aware of d/c today, & dme:02/w/c to be brought to rm-[facility] will manage all further dme needed.  No further CM needs.                    Action/Plan:d/c home w/[facility]   Expected Discharge Date:                  Expected Discharge Plan:  Home w Hospice Care  In-House Referral:     Discharge planning Services  CM Consult  Post Acute Care Choice:    Choice offered to:  Spouse  DME Arranged:  Oxygen, Wheelchair manual DME Agency:     HH Arranged:  RN [place] Agency:  [facility] [place]  Status of Service:  Completed, signed off  If discussed at [employer] of Stay Meetings, dates discussed:    Additional Comments:  Dessa Phi, RN 05/06/2016, 2:04 PM

## 2016-05-06 NOTE — Care Management Note (Signed)
Case Management Note  Patient Details  Name: John [name] MRN: [id number] Date of Birth: Aug 23, 1940  Subjective/Objective: Spouse chose [facility]-referral called to [facility]-await call back. Spouse would like to keep own pcp-Dr. Alysia Penna currently but will consider changing later.  DME needed:02, w/c to be delivered to rm prior d/c. Other dme to be discussed later.Spouse will transport home on own w/02,& w/c. Await call back from [place].                    Action/Plan:d/c home w/hospice   Expected Discharge Date:                  Expected Discharge Plan:  Home w Hospice Care  In-House Referral:     Discharge planning Services  CM Consult  Post Acute Care Choice:    Choice offered to:  Spouse  DME Arranged:    DME Agency:     HH Arranged:    [place] Agency:     Status of Service:  In process, will continue to follow  If discussed at Long Length of Stay Meetings, dates discussed:    Additional Comments:  Dessa Phi, RN 05/06/2016, 1:19 PM

## 2016-05-06 NOTE — Progress Notes (Signed)
Notified by Dessa Phi,  CMRN of family request for Hospice and [place] services at home after discharge. Chart and patient information currently under review to confirm hospice eligibility.  Spoke with the patient and wife at bedside to initiate education related to hospice philosophy, services and team approach to care. Family verbalized understanding of the information provided. Per discussion plan is for discharge to home by personal vehicle later today after oxygen has been delivered.  Wife is going home now to wait for 02 to be delivered.   She will notify the unit once it is received and she will return to transport patient home by care.   Please send signed completed DNR form home with patient.  Patient will need prescriptions for discharge comfort medications.  DME needs discussed and family requested an oxygen concentrator and w/c.   [facility] has also ordered a portable e-tank for use during transfer home by car.  [facility] equipment manager Jewel Ysidro Evert notified and will contact [place] to arrange delivery to the home.   The home address has been verified and is correct in the chart; Mrs. Bierlein is the family member to be contacted to arrange time of delivery.  [facility]erral [facility] aware of the above.  Completed discharge summary will need to be faxed to [facility] at [phone] when final.  Please notify [facility] when patient is ready to leave unit at discharge-call [phone].  [facility] information and contact numbers have been given to the wife  during visit.  Above information shared with Dessa Phi  [facility]  Please call with any questions.  Thank You,  Mickie Kay, [place] [facility] Liaison  [phone]

## 2016-05-06 NOTE — ED Notes (Signed)
Hospitalist at bedside 

## 2016-05-06 NOTE — Care Management Note (Signed)
Case Management Note  Patient Details  Name: John [name] MRN: [id number] Date of Birth: 1941/07/16  Subjective/Objective: Referral for home hospice                    Action/Plan:   Expected Discharge Date:                  Expected Discharge Plan:  Home w Hospice Care  In-House Referral:     Discharge planning Services  CM Consult  Post Acute Care Choice:    Choice offered to:  Spouse  DME Arranged:    DME Agency:     HH Arranged:    [place] Agency:     Status of Service:  In process, will continue to follow  If discussed at Long Length of Stay Meetings, dates discussed:    Additional Comments:  Dessa Phi, RN 05/06/2016, 12:53 PM

## 2016-05-06 NOTE — H&P (Signed)
History and Physical    John [name] [id number] DOB: January 31, 1941 DOA: 05/05/2016  PCP: Laurey Morale, MD   Patient coming from: Home   Chief Complaint: Dyspnea, weakness   HPI: John [name] is a 75 y.o. male with medical history significant for bipolar disorder, GERD, and myeloproliferative disorder who presents the emergency department with worsening dyspnea and generalized weakness. He reports progressive dyspnea with minimal exertion and a generalized weakness which has been present for a long time and attributed to his myeloproliferative disorder, but has worsened acutely over the past couple days, and particularly the last 24 hours. Patient denies any fevers or chills, denies chest pain or palpitations, and denies cough or wheeze. Patient notes some indigestion, but denies vomiting or diarrhea. He denies melena or hematochezia. Patient has been under the care of hematology and oncology for management of his myeloproliferative disorder. His treatment has been complicated by intolerance of hydroxyurea and significant anemia. He has been managed with Aranesp by Dr. Alen Blew.   ED Course: Upon arrival to the ED, patient is found to be afebrile, saturating adequately on room air, tachycardic in the low 100s, and hypotensive to 78/50. EKG demonstrates a sinus tachycardia with rate 103 and chest x-ray is negative for acute cardiopulmonary disease. Chemistry panel is notable for BUN of 46 and creatinine 1.97, up from an apparent baseline of 1.4. CBC features a leukocytosis to 0000000 with neutrophilic predominance, hemoglobin of 5.6 (down from 6.3 three weeks ago), and thrombocytosis with platelet count of 563,000. Troponin was undetectable and BNP was mildly elevated to 254. Patient was given a liter of normal saline, blood cultures were obtained, and 2 units of packed red blood cells were ordered for immediate transfusion. Patient refused the blood transfusion, citing his wife's faith as a  [place] for the reason. His wife was supportive of this decision. He was given a dose of Ativan for anxiety, tachycardia resolved and blood pressure improved with IV fluids and the patient remained in no apparent respiratory distress. He will be admitted to the telemetry unit for ongoing evaluation and management of symptomatic anemia, likely secondary to his worsening myeloproliferative disorder.  Review of Systems:  All other systems reviewed and apart from HPI, are negative.  Past Medical History:  Diagnosis Date  . Anxiety states   . Arthritis   . Bipolar disorder [facility])    sees Dr. Tyrell Antonio in [place]  . Colon polyps   . Depression   . Diverticulitis    history of  . Diverticulosis of colon   . Gastritis   . GERD (gastroesophageal reflux disease)   . H/O: CVA (cardiovascular accident)    has had two so far  . Hemorrhoids, external   . Hemorrhoids, internal   . HTN (hypertension)   . IBS (irritable bowel syndrome)    see Dr. Carlean Purl  . Pelvic floor dysfunction   . 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 Thrombocythemia, essential ([place])    sees Dr. Valinda Party at [facility]   . Tubular adenoma 05/08/2009    Past Surgical History:  Procedure Laterality Date  . APPENDECTOMY    . CARPAL TUNNEL RELEASE     left arm  . COLONOSCOPY  05/08/2009   adenoma, severe sigmid diverticulosis, int hemorrhoids, NL  random colon bxs and terminal ileum  . ESOPHAGOGASTRODUODENOSCOPY  10/04/2002   gastritis  . left elbow surgery x 2     x 2  . SPINE SURGERY    .  TONSILLECTOMY       reports that he has quit smoking. He has never used smokeless tobacco. He reports that he does not drink alcohol or use drugs.  Allergies  Allergen Reactions  . Latex     REACTION: rash    Family History  Problem Relation Age of Onset  . Alcohol abuse Father   . Ovarian cancer Mother   . Celiac disease Brother   . Colon cancer Neg Hx      Prior to Admission  medications   Medication Sig Start Date End Date Taking? Authorizing Provider  aspirin (GOODSENSE ASPIRIN) 325 MG tablet Take 325 mg by mouth daily.    Yes Historical Provider, MD  divalproex (DEPAKOTE SPRINKLE) 125 MG capsule Take 125 mg by mouth 3 (three) times daily.  02/18/16  Yes Historical Provider, MD  hydrOXYzine (ATARAX/VISTARIL) 10 MG tablet Take 1 tablet (10 mg total) by mouth 3 (three) times daily as needed. Patient taking differently: Take 10 mg by mouth 3 (three) times daily as needed for itching.  05/01/16  Yes Wyatt Portela, MD  oxyCODONE (OXY IR/ROXICODONE) 5 MG immediate release tablet Take 1 tablet (5 mg total) by mouth every 4 (four) hours as needed for moderate pain. 08/17/15  Yes Nishant Dhungel, MD  vitamin B-12 (CYANOCOBALAMIN) 100 MCG tablet Take 100 mcg by mouth daily.   Yes Historical Provider, MD  doxylamine, Sleep, (UNISOM) 25 MG tablet Take 25 mg by mouth at bedtime as needed for sleep.    Historical Provider, MD  risperiDONE (RISPERDAL) 1 MG tablet Take 1 mg by mouth daily.     Historical Provider, MD  sertraline (ZOLOFT) 100 MG tablet Take 150 mg by mouth every evening.     Historical Provider, MD    Physical Exam: Vitals:   05/05/16 2315 05/05/16 2330 05/05/16 2344 05/05/16 2345  BP: 102/70 92/67 92/67  (!) 91/52  Pulse: 70  87   Resp: (!) 29  18   Temp:   98.1 F (36.7 C)   TempSrc:   Oral   SpO2: (!) 78%  91%       Constitutional: NAD, calm, comfortable. Pale, chronically-ill appearing Eyes: PERTLA, lids and conjunctivae normal ENMT: Mucous membranes are moist. Posterior pharynx clear of any exudate or lesions.   Neck: normal, supple, no masses, no thyromegaly Respiratory: clear to auscultation bilaterally, no wheezing, no crackles. Normal respiratory effort.   Cardiovascular: S1 & S2 heard, regular rate and rhythm. Hyperdynamic precordium. No extremity edema. No significant JVD. Abdomen: No distension, no tenderness, no masses palpated. Bowel sounds  normal.  Musculoskeletal: no clubbing / cyanosis. No joint deformity upper and lower extremities. Normal muscle tone.  Skin: no significant rashes, lesions, ulcers. Warm, dry, well-perfused. Neurologic: CN 2-12 grossly intact. Sensation intact, DTR normal. Strength 5/5 in all 4 limbs.  Psychiatric: Normal judgment and insight. Alert and oriented x 3. Normal mood and affect.     Labs on Admission: I have personally reviewed following labs and imaging studies  CBC:  Recent Labs Lab 05/05/16 2053  WBC 49.6*  NEUTROABS 41.6*  HGB 5.6*  HCT 17.8*  MCV 78.8  PLT [id number]   Basic Metabolic Panel:  Recent Labs Lab 05/05/16 2053  NA 137  K 4.8  CL 111  CO2 20*  GLUCOSE 122*  BUN 46*  CREATININE 1.97*  CALCIUM 8.9   GFR: CrCl cannot be calculated (Unknown ideal weight.). Liver Function Tests:  Recent Labs Lab 05/05/16 2053  AST 14*  ALT 10*  ALKPHOS 66  BILITOT 0.4  PROT 8.1  ALBUMIN 3.9   No results for input(s): LIPASE, AMYLASE in the last 168 hours. No results for input(s): AMMONIA in the last 168 hours. Coagulation Profile: No results for input(s): INR, PROTIME in the last 168 hours. Cardiac Enzymes:  Recent Labs Lab 05/05/16 2053  TROPONINI <0.03   BNP (last 3 results) No results for input(s): PROBNP in the last 8760 hours. HbA1C: No results for input(s): HGBA1C in the last 72 hours. CBG: No results for input(s): GLUCAP in the last 168 hours. Lipid Profile: No results for input(s): CHOL, HDL, LDLCALC, TRIG, CHOLHDL, LDLDIRECT in the last 72 hours. Thyroid Function Tests: No results for input(s): TSH, T4TOTAL, FREET4, T3FREE, THYROIDAB in the last 72 hours. Anemia Panel: No results for input(s): VITAMINB12, FOLATE, FERRITIN, TIBC, IRON, RETICCTPCT in the last 72 hours. Urine analysis:    Component Value Date/Time   COLORURINE YELLOW 08/15/2015 [place] 08/15/2015 2215   LABSPEC 1.015 08/15/2015 2215   PHURINE 6.0 08/15/2015 2215    GLUCOSEU NEGATIVE 08/15/2015 2215   HGBUR NEGATIVE 08/15/2015 2215   BILIRUBINUR NEGATIVE 08/15/2015 2215   KETONESUR NEGATIVE 08/15/2015 2215   PROTEINUR NEGATIVE 08/15/2015 2215   UROBILINOGEN 1.0 08/07/2010 1514   NITRITE NEGATIVE 08/15/2015 2215   LEUKOCYTESUR NEGATIVE 08/15/2015 2215   Sepsis Labs: @LABRCNTIP (procalcitonin:4,lacticidven:4) )No results found for this or any previous visit (from the past 240 hour(s)).   Radiological Exams on Admission: Ct Angio Chest Pe W Or Wo Contrast  Result Date: 05/06/2016 CLINICAL DATA:  75 year old male with shortness of breath EXAM: CT ANGIOGRAPHY CHEST WITH CONTRAST TECHNIQUE: Multidetector CT imaging of the chest was performed using the standard protocol during bolus administration of intravenous contrast. Multiplanar CT image reconstructions and MIPs were obtained to evaluate the vascular anatomy. CONTRAST:  80 cc Isovue 370 COMPARISON:  Chest radiograph dated 05/05/2016 FINDINGS: Evaluation of this exam is limited due to respiratory motion artifact. There is mild diffuse ground-glass density throughout the lungs with interlobular septal prominence likely representing mild interstitial edema. There is no focal consolidation. No pleural effusion or pneumothorax noted. Small scattered pneumatoceles. The central airways are patent. There is atherosclerotic calcification of the thoracic aorta. No aneurysmal dilatation or evidence of dissection. The origins of the great vessels of the aortic arch appear patent. Evaluation of the pulmonary arteries is limited due to respiratory motion artifact and suboptimal opacification of the peripheral branches. No definite central pulmonary artery embolus identified. There is mild cardiomegaly. No pericardial effusion. There is coronary vascular calcification. There is no hilar or mediastinal adenopathy. The esophagus is grossly unremarkable. There is no axillary adenopathy. The chest wall soft tissues appear  unremarkable. There is degenerative changes of the spine. No acute fracture. There is splenomegaly measuring up to 19 cm of indeterminate etiology. Clinical correlation is recommended. The visualized upper abdomen is otherwise unremarkable. IMPRESSION: No CT evidence of central pulmonary artery embolus. Cardiomegaly with mild interstitial edema.  No focal consolidation. Splenomegaly. Electronically Signed   By: Anner Crete M.D.   On: 05/06/2016 00:09   Dg Chest Port 1 View  Result Date: 05/05/2016 CLINICAL DATA:  Short of breath EXAM: PORTABLE CHEST 1 VIEW COMPARISON:  03/26/2010 FINDINGS: The heart size and mediastinal contours are within normal limits. Both lungs are clear. The visualized skeletal structures are unremarkable. IMPRESSION: No active disease. Electronically Signed   By: Franchot Gallo M.D.   On: 05/05/2016 21:45    EKG: Independently reviewed. Sinus tachycardia (  rate 103), APC  Assessment/Plan  1. Symptomatic anemia  - Hgb 5.6 on admission, down from 6.3 at the end of August, and 7.0 in mid-August - Likely secondary to progressive myeloproliferative disorder with myelofibrosis  - Denies melena or hematochezia, no sign of active bleeding, treating his indigestion and GERD aggressively as below   - Refuses blood products due to his wife being a Jehovah's Witness - Managing supportively with IVF, supplemental oxygen   2. Myeloproliferative disorder with myelofibrosis  - Followed by Dr. Alen Blew of heme/onc  - Hx of hydroxyurea intolerance; other treatment modalities have been precluded by significant anemia   3. AKI superimposed on CKD stage III    - SCr 1.97 on admission, up from apparent baseline of 1.4  - Likely secondary to anemia and hypotension  - Hypotension and tachycardia resolved with NS bolus in ED, continuing IVF with NS  - Avoid nephrotoxins; repeat chem panel in am    4. GERD - Gastritis only on EGD in 2004 and indefinite PPI tx advised; pt no longer taking  PPI or H2-blocker - Complains of indigestion in ED; denies melena or hematochezia - Treating with Pepcid IV q12h and oral Protonix given critical IV Protonix shortage    5. Bipolar disorder  - Appears to be stable; pt denies SI, HI, or hallucination at time of admission - Continue Risperdal, Depakote, Zoloft    DVT prophylaxis: SCD's  Code Status: Full  Family Communication: Discussed with patient Disposition Plan: Admit to telemetry Consults called: None Admission status: Inpatient    Vianne Bulls, MD [facility] Pager [phone]  If 7PM-7AM, please contact night-coverage [email or web address] Password [email or web address]  05/06/2016, 12:20 AM

## 2016-05-06 NOTE — Progress Notes (Signed)
Nutrition Brief Note  Patient identified on the Malnutrition Screening Tool (MST) Report  Wt Readings from Last 15 Encounters:  05/06/16 177 lb 0.5 oz (80.3 kg)  04/10/16 174 lb 3.2 oz (79 kg)  01/17/16 171 lb 12.8 oz (77.9 kg)  10/26/15 169 lb 4.8 oz (76.8 kg)  08/24/15 167 lb 8 oz (76 kg)  08/16/15 164 lb 0.4 oz (74.4 kg)  07/31/15 166 lb (75.3 kg)  12/02/13 179 lb 8 oz (81.4 kg)  11/18/13 181 lb (82.1 kg)  05/26/13 180 lb 4.8 oz (81.8 kg)  02/23/13 183 lb 9.6 oz (83.3 kg)  10/11/12 189 lb (85.7 kg)  05/22/12 173 lb 1 oz (78.5 kg)  04/08/12 189 lb (85.7 kg)  11/17/11 196 lb (88.9 kg)    Body mass index is 25.4 kg/m. Patient meets criteria for overweight based on current BMI.   Current diet order is Regular, patient is consuming 100% of meals at this time. Skin WDL. Labs and medications reviewed.   Ensure Enlive currently ordered BID, each supplement provides 350 kcal and 20 grams of protein. Will decrease order to Ensure Enlive once/day. No further nutrition interventions warranted at this time. If nutrition issues arise, please consult RD.    Jarome Matin, MS, RD, LDN Inpatient Clinical Dietitian Pager # [phone] After hours/weekend pager # [phone]

## 2016-05-07 ENCOUNTER — Other Ambulatory Visit: Payer: Self-pay | Admitting: Family Medicine

## 2016-05-07 NOTE — Telephone Encounter (Signed)
Pt needs refills on depakate 125 mg #90 w/refills, zoloft 150 mg, and risperdal 1 mg [facility] [place]

## 2016-05-07 NOTE — Telephone Encounter (Signed)
Call in #90 with one refill of each

## 2016-05-08 MED ORDER — DIVALPROEX SODIUM 125 MG PO CSDR: 125.0000 mg | DELAYED_RELEASE_CAPSULE | Freq: Three times a day (TID) | ORAL | 1 refills | Status: AC

## 2016-05-08 MED ORDER — RISPERIDONE 1 MG PO TABS: 1.0000 mg | ORAL_TABLET | Freq: Every day | ORAL | 0 refills | Status: DC

## 2016-05-08 MED ORDER — SERTRALINE HCL 100 MG PO TABS: 150.0000 mg | ORAL_TABLET | Freq: Every evening | ORAL | 0 refills | Status: DC

## 2016-05-08 MED ORDER — DIVALPROEX SODIUM 125 MG PO CSDR: 125.0000 mg | DELAYED_RELEASE_CAPSULE | Freq: Three times a day (TID) | ORAL | 0 refills | Status: DC

## 2016-05-08 MED ORDER — SERTRALINE HCL 100 MG PO TABS: 150.0000 mg | ORAL_TABLET | Freq: Every evening | ORAL | 1 refills | Status: AC

## 2016-05-08 MED ORDER — RISPERIDONE 1 MG PO TABS
1.0000 mg | ORAL_TABLET | Freq: Every day | ORAL | 1 refills | Status: AC
Start: 2016-05-08 — End: ?

## 2016-05-08 NOTE — Telephone Encounter (Signed)
Rxs sent

## 2016-05-10 LAB — CULTURE, BLOOD (ROUTINE X 2)
CULTURE: NO GROWTH
CULTURE: NO GROWTH

## 2016-05-21 ENCOUNTER — Telehealth: Payer: Self-pay | Admitting: *Deleted

## 2016-05-21 NOTE — Telephone Encounter (Signed)
"  [place] [phone])  Calling to notify you this patient is Jehovah's witness.  Contemplating blood.  Thought further about blood transfusions and not opposed to receiving blood.  Could you call me with any care plan updates.  Do we need to draw any labs."

## 2016-06-06 ENCOUNTER — Telehealth: Payer: Self-pay | Admitting: Family Medicine

## 2016-06-06 NOTE — Telephone Encounter (Signed)
° °  Tonya from [facility] called to say pt was rx prilosec when he was in the hospital and is asking for RX to refill this med   [facility]  [place]

## 2016-06-09 NOTE — Telephone Encounter (Signed)
Refill Prilosec 20 mg daily for one year

## 2016-06-10 MED ORDER — OMEPRAZOLE 20 MG PO CPDR: 20.0000 mg | DELAYED_RELEASE_CAPSULE | Freq: Every day | ORAL | 11 refills | Status: AC

## 2016-06-10 NOTE — Addendum Note (Signed)
Addended by: Aggie Hacker A on: 06/10/2016 01:53 PM   Modules accepted: Orders

## 2016-06-10 NOTE — Telephone Encounter (Signed)
I sent script e-scribe to below pharmacy and spoke with Kenney Houseman at [facility]

## 2016-06-20 ENCOUNTER — Encounter: Payer: Self-pay | Admitting: *Deleted

## 2016-07-07 ENCOUNTER — Other Ambulatory Visit: Payer: Self-pay | Admitting: *Deleted

## 2016-07-07 MED ORDER — HYDROXYZINE HCL 10 MG PO TABS: 10.0000 mg | ORAL_TABLET | Freq: Three times a day (TID) | ORAL | 1 refills | Status: AC | PRN

## 2016-07-10 ENCOUNTER — Telehealth: Payer: Self-pay | Admitting: Oncology

## 2016-07-10 ENCOUNTER — Ambulatory Visit: Payer: Medicare Other

## 2016-07-10 ENCOUNTER — Ambulatory Visit (HOSPITAL_BASED_OUTPATIENT_CLINIC_OR_DEPARTMENT_OTHER): Admitting: Oncology

## 2016-07-10 ENCOUNTER — Other Ambulatory Visit (HOSPITAL_BASED_OUTPATIENT_CLINIC_OR_DEPARTMENT_OTHER)

## 2016-07-10 VITALS — BP 103/71 | HR 62 | Temp 98.6°F | Resp 18 | Ht 70.0 in | Wt 178.0 lb

## 2016-07-10 DIAGNOSIS — D473 Essential (hemorrhagic) thrombocythemia: Secondary | ICD-10-CM

## 2016-07-10 DIAGNOSIS — D631 Anemia in chronic kidney disease: Secondary | ICD-10-CM | POA: Diagnosis not present

## 2016-07-10 DIAGNOSIS — D45 Polycythemia vera: Secondary | ICD-10-CM

## 2016-07-10 DIAGNOSIS — N189 Chronic kidney disease, unspecified: Secondary | ICD-10-CM

## 2016-07-10 DIAGNOSIS — D7581 Myelofibrosis: Secondary | ICD-10-CM

## 2016-07-10 DIAGNOSIS — L299 Pruritus, unspecified: Secondary | ICD-10-CM

## 2016-07-10 LAB — IRON AND TIBC
%SAT: 20 % (ref 20–55)
IRON: 67 ug/dL (ref 42–163)
TIBC: 338 ug/dL (ref 202–409)
UIBC: 271 ug/dL (ref 117–376)

## 2016-07-10 LAB — MANUAL DIFFERENTIAL
ALC: 7.6 10*3/uL — ABNORMAL HIGH (ref 0.9–3.3)
ANC (CHCC manual diff): 37.9 10*3/uL — ABNORMAL HIGH (ref 1.5–6.5)
BAND NEUTROPHILS: 10 % (ref 0–10)
BASOPHIL: 4 % — AB (ref 0–2)
Blasts: 1 % — ABNORMAL HIGH (ref 0–0)
EOS: 0 % (ref 0–7)
LYMPH: 14 % (ref 14–49)
MONO: 11 % (ref 0–14)
Metamyelocytes: 6 % — ABNORMAL HIGH (ref 0–0)
Myelocytes: 7 % — ABNORMAL HIGH (ref 0–0)
NRBC: 1 % — AB (ref 0–0)
OTHER CELL: 0 % (ref 0–0)
PLT EST: INCREASED
PROMYELO: 0 % (ref 0–0)
SEG: 47 % (ref 38–77)
Variant Lymph: 0 % (ref 0–0)

## 2016-07-10 LAB — CBC WITH DIFFERENTIAL/PLATELET
HEMATOCRIT: 23.1 % — AB (ref 38.4–49.9)
HEMOGLOBIN: 6.8 g/dL — AB (ref 13.0–17.1)
MCH: 24.2 pg — ABNORMAL LOW (ref 27.2–33.4)
MCHC: 29.3 g/dL — ABNORMAL LOW (ref 32.0–36.0)
MCV: 82.7 fL (ref 79.3–98.0)
Platelets: 608 10*3/uL — ABNORMAL HIGH (ref 140–400)
RBC: 2.8 10*6/uL — ABNORMAL LOW (ref 4.20–5.82)
RDW: 23.6 % — AB (ref 11.0–14.6)
WBC: 54.2 10*3/uL — AB (ref 4.0–10.3)

## 2016-07-10 LAB — FERRITIN: FERRITIN: 173 ng/mL (ref 22–316)

## 2016-07-10 NOTE — Progress Notes (Signed)
Hematology and Oncology Follow Up Visit  John [name] [id number] Nov 05, 1940 74 y.o. 07/10/2016 11:57 AM John [name], MDFry, John Holter, MD   Principle Diagnosis: 75 year old gentleman with Myeloproliferative disorder presenting with essential thrombocythemia and myelofibrosis diagnosed in 03/2012. He was diagnosed at [facility] hematology oncology by John [name] and underwent a bone marrow biopsy on 03/11/2012.   He has anemia of renal disease given his chronic renal insufficiency.  Prior Therapy: He was started on hydroxyurea and tolerated it poorly in 2013. He developed pancytopenia and required hospitalization back in October of 2013   Current therapy: Supportive care only.  Interim History: John [name] presents today for a followup visit. Since the last visit, he was hospitalized briefly in September 2017 for hypotension and failure to thrive. He recovered reasonably well from that hospitalization although he is currently under hospice care. He declined packed red cell transfusions in the past and continues to do so. Clinically he remains stable without any progressive weakness, shortness of breath or difficulty breathing. His quality of life is not dramatically changed. He does report some occasional tremors but no other neurological deficits. His appetite remains reasonable and his weight is stable.   He has not reported any headaches, blurry vision, syncope or seizures. He does not report any fevers, chills, sweats or weight loss. He does not report any chest pain, palpitation orthopnea. Does not report any cough, wheezing or hemoptysis. Does not report a nausea, vomiting is report some lower abdomen discomfort. Does not report any frequency urgency or hesitancy. Remaining review of systems unremarkable.  Medications: I have reviewed the patient's current medications.  Current Outpatient Prescriptions  Medication Sig Dispense Refill  . divalproex (DEPAKOTE  SPRINKLE) 125 MG capsule Take 1 capsule (125 mg total) by mouth 3 (three) times daily. 270 capsule 1  . doxylamine, Sleep, (UNISOM) 25 MG tablet Take 25 mg by mouth at bedtime as needed for sleep.    . hydrOXYzine (ATARAX/VISTARIL) 10 MG tablet Take 1 tablet (10 mg total) by mouth 3 (three) times daily as needed for itching. 90 tablet 1  . Morphine Sulfate (MORPHINE CONCENTRATE) 10 mg / 0.5 ml concentrated solution Take 0.25 mLs (5 mg total) by mouth every hour as needed for moderate pain, severe pain or shortness of breath. 30 mL 0  . omeprazole (PRILOSEC) 20 MG capsule Take 1 capsule (20 mg total) by mouth daily. 30 capsule 0  . omeprazole (PRILOSEC) 20 MG capsule Take 1 capsule (20 mg total) by mouth daily. 30 capsule 11  . polyethylene glycol (MIRALAX / GLYCOLAX) packet Take 17 g by mouth daily. 100 each 0  . promethazine (PHENERGAN) 25 MG tablet Take 1 tablet (25 mg total) by mouth every 6 (six) hours as needed for nausea or vomiting. 30 tablet 0  . risperiDONE (RISPERDAL) 1 MG tablet Take 1 tablet (1 mg total) by mouth daily. 90 tablet 1  . senna (SENOKOT) 8.6 MG TABS tablet Take 2 tablets (17.2 mg total) by mouth at bedtime. 120 each 0  . sertraline (ZOLOFT) 100 MG tablet Take 1.5 tablets (150 mg total) by mouth every evening. 135 tablet 1  . vitamin B-12 (CYANOCOBALAMIN) 100 MCG tablet Take 100 mcg by mouth daily.     No current facility-administered medications for this visit.      Allergies:  Allergies  Allergen Reactions  . Latex Rash    Past Medical History, Surgical history, Social history, and Family History were reviewed and updated.  Physical  Exam: Blood pressure 103/71, pulse 62, temperature 98.6 F (37 C), temperature source Oral, resp. rate 18, height '5\' 10"'$  (1.778 m), weight 178 lb (80.7 kg), SpO2 99 %. ECOG: 1 General appearance: Ill-appearing gentleman appeared without distress today. Head: Normocephalic, without obvious abnormality no oral thrush. Neck: no  adenopathy Lymph nodes: Cervical, supraclavicular, and axillary nodes normal. Heart:regular rate and rhythm, S1, S2 normal, no murmur, click, rub or gallop Lung:chest clear, no wheezing, rales, normal symmetric air entry Abdomin: soft, non-tender, without masses. Splenomegaly noted on exam.  EXT:no erythema, induration, or nodules   Lab Results: Lab Results  Component Value Date   WBC 54.2 (HH) 07/10/2016   HGB 6.8 (LL) 07/10/2016   HCT 23.1 (L) 07/10/2016   MCV 82.7 07/10/2016   PLT 608 (H) 07/10/2016     Chemistry      Component Value Date/Time   NA 137 05/06/2016 0058   NA 140 11/18/2013 1421   K 4.6 05/06/2016 0058   K 4.6 11/18/2013 1421   CL 114 (H) 05/06/2016 0058   CO2 18 (L) 05/06/2016 0058   CO2 21 (L) 11/18/2013 1421   BUN 40 (H) 05/06/2016 0058   BUN 21.8 11/18/2013 1421   CREATININE 1.78 (H) 05/06/2016 0058   CREATININE 1.4 (H) 11/18/2013 1421      Component Value Date/Time   CALCIUM 8.1 (L) 05/06/2016 0058   CALCIUM 9.1 11/18/2013 1421   ALKPHOS 66 05/05/2016 2053   ALKPHOS 41 11/18/2013 1421   AST 14 (L) 05/05/2016 2053   AST 24 11/18/2013 1421   ALT 10 (L) 05/05/2016 2053   ALT 8 11/18/2013 1421   BILITOT 0.4 05/05/2016 2053   BILITOT 0.29 11/18/2013 1421      Impression and Plan:  75 year old gentleman with the following issues:  1. Myeloproliferative disorder with myelofibrosis: He has JAK 2 positive mutation diagnosed in 2013. He was on hydroxyurea briefly but tolerated it poorly. He appears to have progressive disease with splenomegaly, leukocytosis and anemia.  He is not a candidate for any aggressive therapy at this time and I recommended supportive care only. Cytoreduction would be difficult given his profound anemia and his poor tolerance to hydroxyurea in the past.   I recommended supportive care only at this time and address symptoms as they arise.   2. Anemia: He has anemia of renal disease. He did receive Aranesp in the past  although no major changes in his hemoglobin noted. His hemoglobin today actually slightly improved without intervention. I recommended observation and surveillance at this time.    3. Prognosis: poor given his other comorbid conditions and the progressive nature of this disease. He remains under hospice care which I agree with this at this time. He does have DO NOT RESUSCITATE CODE STATUS.  4. Pruritus: Prescription for Atarax was made available patient.  5. Follow-up: Will be in 4 months.  NHAFBX,UXYBF, MD 11/30/201711:57 AM

## 2016-07-10 NOTE — Telephone Encounter (Signed)
Appointments scheduled per 07/10/16 los. A copy of the AVS report and appointment schedule was given to patient, per 07/10/16 los. °

## 2016-09-03 ENCOUNTER — Encounter: Payer: Self-pay | Admitting: Family Medicine

## 2016-09-12 DIAGNOSIS — H31302 Unspecified choroidal hemorrhage, left eye: Secondary | ICD-10-CM | POA: Diagnosis not present

## 2016-09-12 DIAGNOSIS — H26493 Other secondary cataract, bilateral: Secondary | ICD-10-CM | POA: Diagnosis not present

## 2016-10-07 ENCOUNTER — Telehealth: Payer: Self-pay | Admitting: Oncology

## 2016-10-07 NOTE — Telephone Encounter (Signed)
Left a VM for patient about the appointment change and if he had any question or concerns to call us [phone]

## 2016-11-06 ENCOUNTER — Other Ambulatory Visit: Payer: Medicare Other

## 2016-11-06 ENCOUNTER — Ambulatory Visit: Payer: Medicare Other | Admitting: Oncology

## 2016-11-07 ENCOUNTER — Ambulatory Visit: Payer: Medicare Other | Admitting: Oncology

## 2016-11-07 ENCOUNTER — Other Ambulatory Visit: Payer: Medicare Other

## 2017-02-13 DIAGNOSIS — D471 Chronic myeloproliferative disease: Secondary | ICD-10-CM | POA: Diagnosis not present

## 2017-02-27 DIAGNOSIS — D471 Chronic myeloproliferative disease: Secondary | ICD-10-CM | POA: Diagnosis not present

## 2017-06-09 ENCOUNTER — Other Ambulatory Visit: Payer: Self-pay | Admitting: Family Medicine

## 2017-07-14 IMAGING — CT CT ANGIO CHEST
2 of 6 series · 18 of 36 positions shown · IV contrast (ISOVUE 370)
Comparison: Chest radiograph dated 05/05/2016

CLINICAL DATA: 75-year-old male with shortness of breath

EXAM:
CT ANGIOGRAPHY CHEST WITH CONTRAST
TECHNIQUE: Multidetector CT imaging of the chest was performed using the
standard protocol during bolus administration of intravenous
contrast. Multiplanar CT image reconstructions and MIPs were
obtained to evaluate the vascular anatomy.
CONTRAST:  80 cc Isovue 370

[Series 6: coronal mpr · coronal · 0.69mm/px · 1 of 121 slices shown]
[im 61/121  mediastinal]
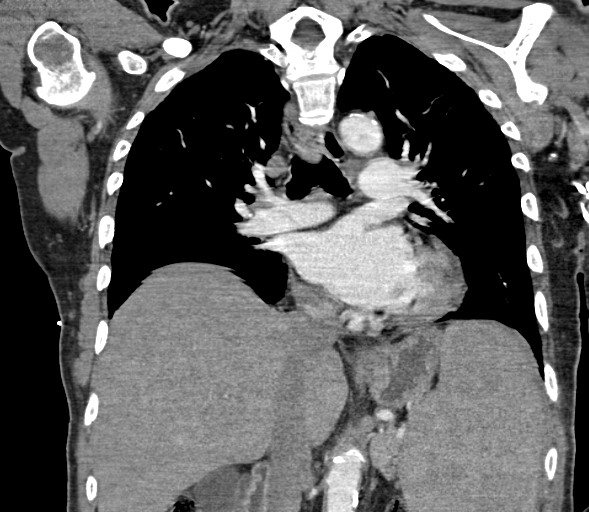

[Series 12: thins for pacs · axial · 0.77mm/px · z∈[-300,+18]mm · 17 of 353 slices shown]
[im 18/353  lung]
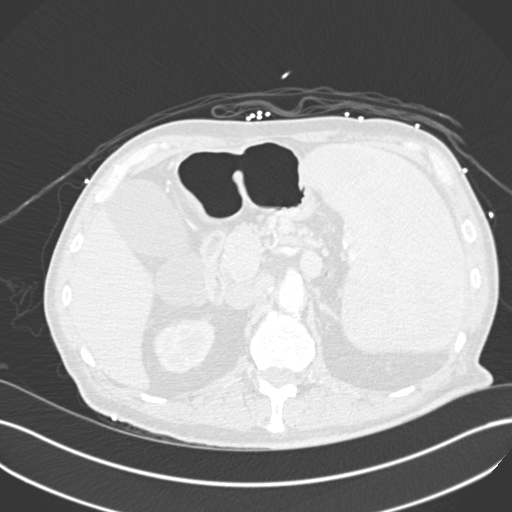
[im 36/353  mediastinal]
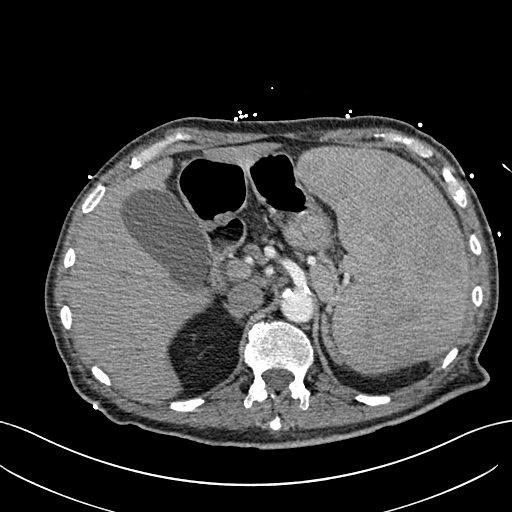
[im 53/353  lung]
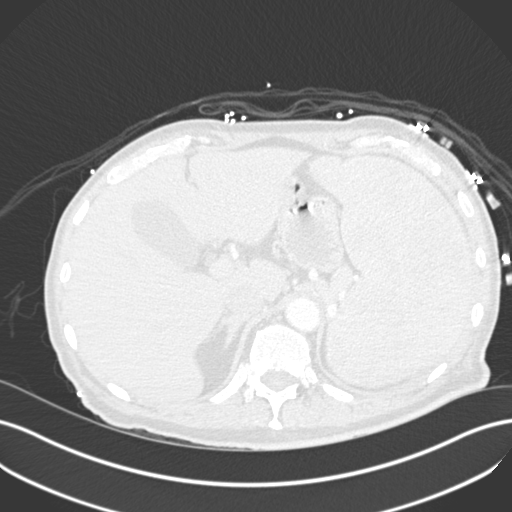
[im 71/353  mediastinal]
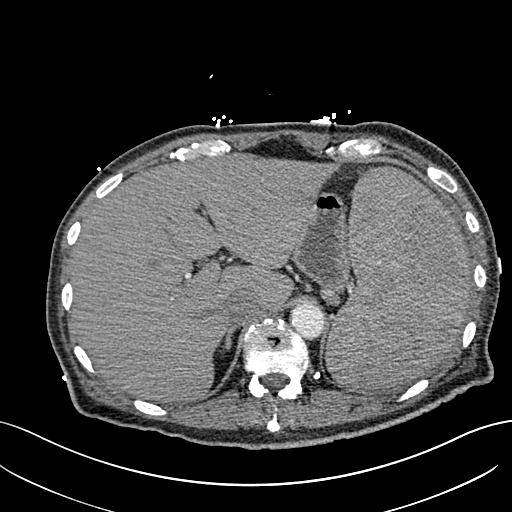
[im 106/353  lung]
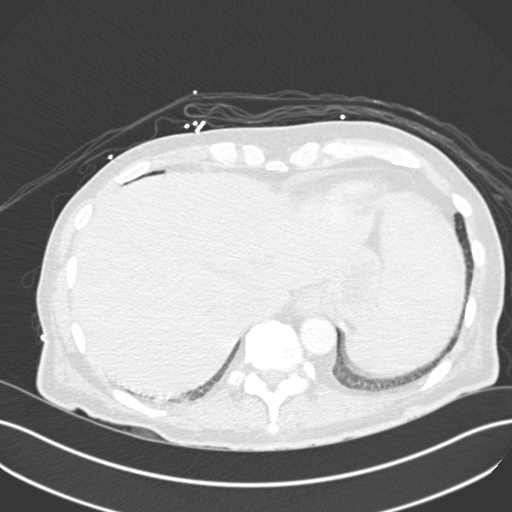
[im 124/353  mediastinal]
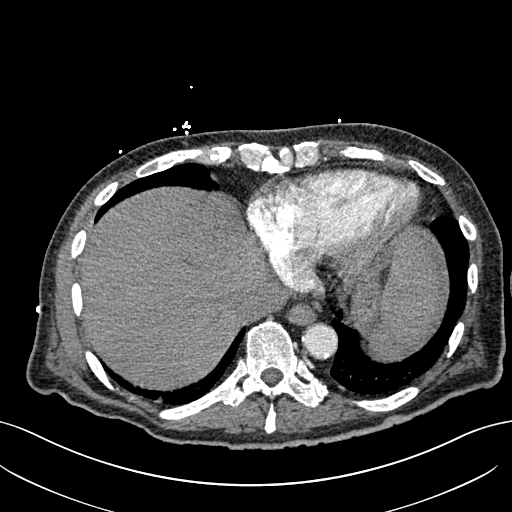
[im 141/353  lung]
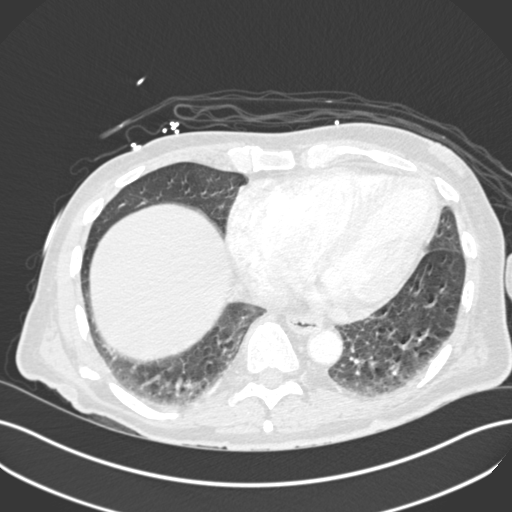
[im 159/353  mediastinal]
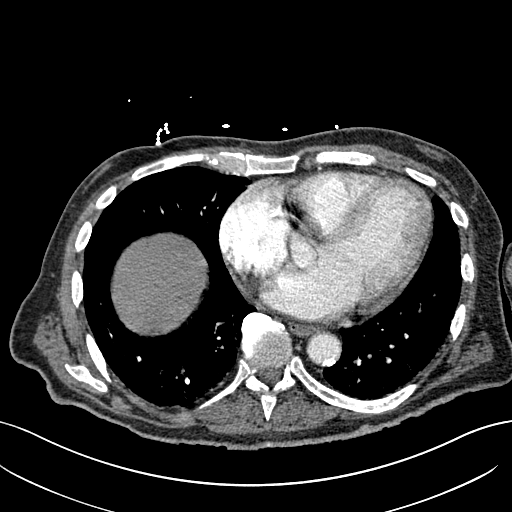
[im 177/353  lung]
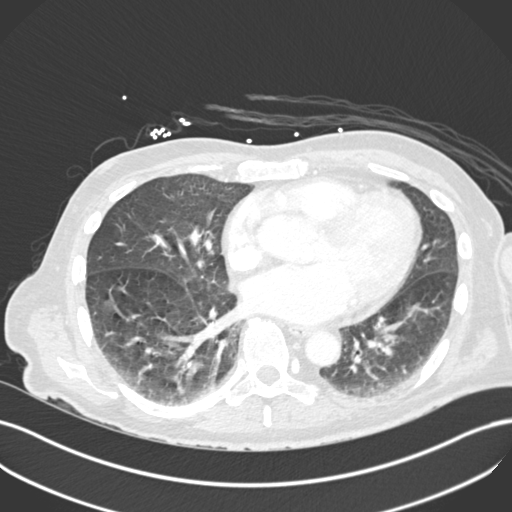
[im 194/353  mediastinal]
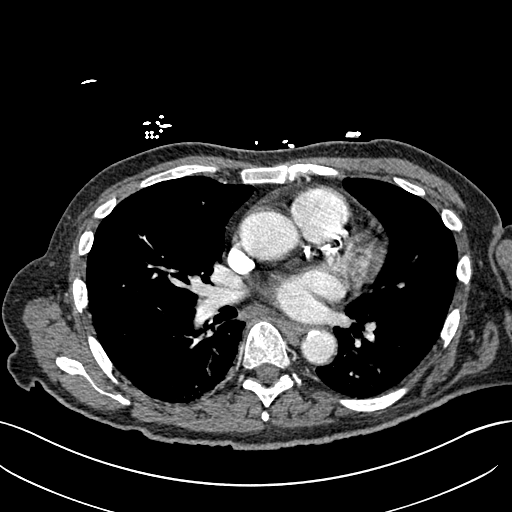
[im 212/353  lung]
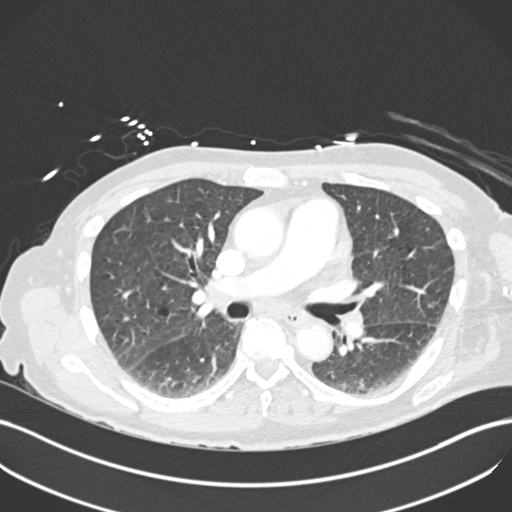
[im 229/353  mediastinal]
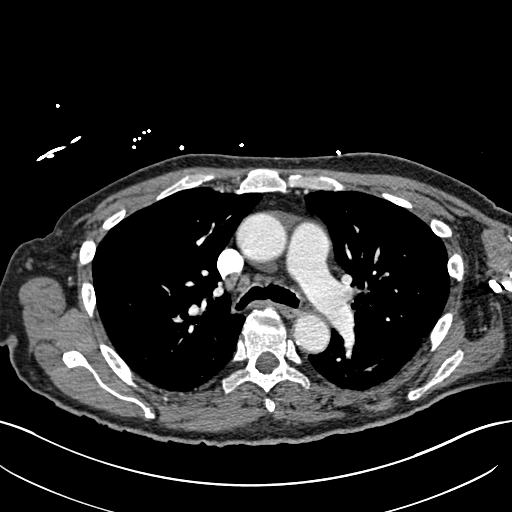
[im 247/353  lung]
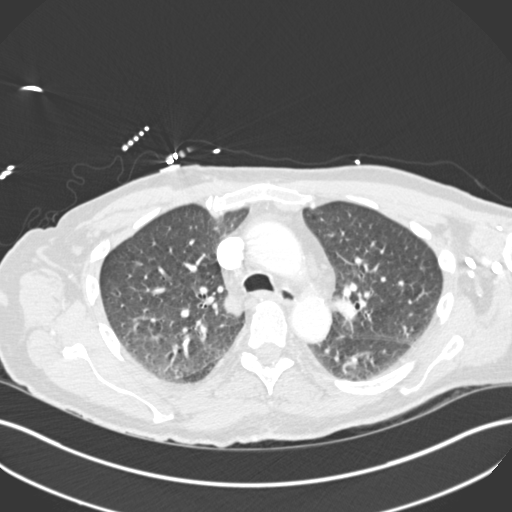
[im 282/353  mediastinal]
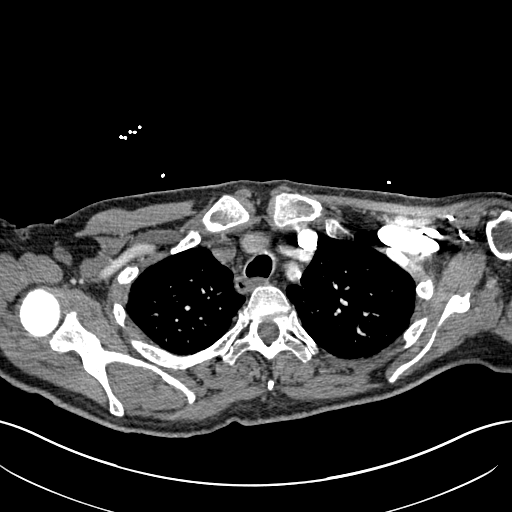
[im 300/353  lung]
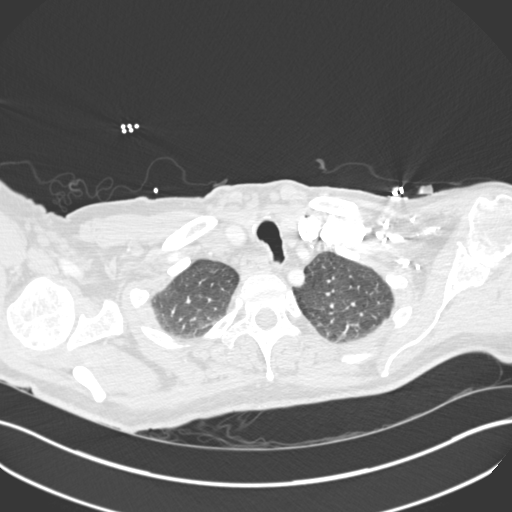
[im 317/353  mediastinal]
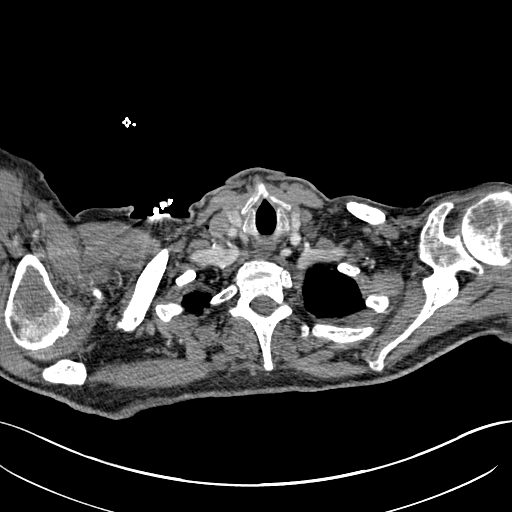
[im 335/353  lung]
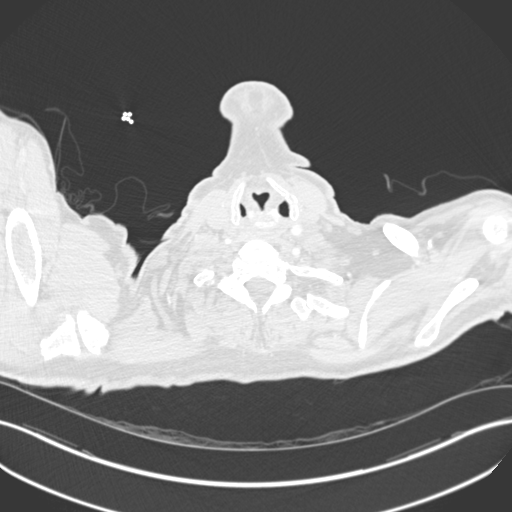

[18 of 36 positions shown; findings below may reference images not displayed]

FINDINGS: Evaluation of this exam is limited due to respiratory motion
artifact.

There is mild diffuse ground-glass density throughout the lungs with
interlobular septal prominence likely representing mild interstitial
edema. There is no focal consolidation. No pleural effusion or
pneumothorax noted. Small scattered pneumatoceles. The central
airways are patent.

There is atherosclerotic calcification of the thoracic aorta. No
aneurysmal dilatation or evidence of dissection. The origins of the
great vessels of the aortic arch appear patent.

Evaluation of the pulmonary arteries is limited due to respiratory
motion artifact and suboptimal opacification of the peripheral
branches. No definite central pulmonary artery embolus identified.
There is mild cardiomegaly. No pericardial effusion. There is
coronary vascular calcification. There is no hilar or mediastinal
adenopathy. The esophagus is grossly unremarkable.

There is no axillary adenopathy. The chest wall soft tissues appear
unremarkable. There is degenerative changes of the spine. No acute
fracture.

There is splenomegaly measuring up to 19 cm of indeterminate
etiology. Clinical correlation is recommended. The visualized upper
abdomen is otherwise unremarkable.
IMPRESSION: No CT evidence of central pulmonary artery embolus.

Cardiomegaly with mild interstitial edema.  No focal consolidation.

Splenomegaly.

## 2017-07-17 ENCOUNTER — Other Ambulatory Visit: Payer: Self-pay | Admitting: Nurse Practitioner

## 2017-09-01 DIAGNOSIS — D471 Chronic myeloproliferative disease: Secondary | ICD-10-CM | POA: Diagnosis not present

## 2017-10-09 DEATH — deceased

## 2017-11-18 ENCOUNTER — Telehealth: Payer: Self-pay | Admitting: Family Medicine

## 2017-11-18 NOTE — Telephone Encounter (Signed)
Copied from [place] [id number]. Topic: Quick Communication - See Telephone Encounter >> Nov 18, 2017  4:39 PM Robina Ade, Helene Kelp D wrote: CRM for notification. See Telephone encounter for: 11/18/17. Shavnne with [facility] will be faxing over a supplemental order for Dr. Sarajane Jews please have a look out because she need it back by today if possible. If any questions her number is [phone].

## 2017-11-20 NOTE — Telephone Encounter (Signed)
Form's have been faxed and placed to be scanned into pt's chart  

## 2017-11-20 NOTE — Telephone Encounter (Signed)
Placed in Dr. Fry's RED folder  

## 2017-11-20 NOTE — Telephone Encounter (Signed)
The form is ready  

## 2021-06-15 ENCOUNTER — Other Ambulatory Visit: Payer: Self-pay | Admitting: Nurse Practitioner
